# Patient Record
Sex: Female | Born: 1950 | Race: Black or African American | Hispanic: No | Marital: Married | Smoking: Never smoker
Health system: Southern US, Community
[De-identification: ages and names within clinical notes are randomized; demographics above are authoritative.]

## PROBLEM LIST (undated history)

## (undated) DIAGNOSIS — I2510 Atherosclerotic heart disease of native coronary artery without angina pectoris: Secondary | ICD-10-CM

## (undated) DIAGNOSIS — T7840XA Allergy, unspecified, initial encounter: Secondary | ICD-10-CM

## (undated) DIAGNOSIS — M1990 Unspecified osteoarthritis, unspecified site: Secondary | ICD-10-CM

## (undated) DIAGNOSIS — K5792 Diverticulitis of intestine, part unspecified, without perforation or abscess without bleeding: Secondary | ICD-10-CM

## (undated) DIAGNOSIS — N200 Calculus of kidney: Secondary | ICD-10-CM

## (undated) DIAGNOSIS — C801 Malignant (primary) neoplasm, unspecified: Secondary | ICD-10-CM

## (undated) DIAGNOSIS — I10 Essential (primary) hypertension: Secondary | ICD-10-CM

## (undated) DIAGNOSIS — E785 Hyperlipidemia, unspecified: Secondary | ICD-10-CM

## (undated) DIAGNOSIS — I219 Acute myocardial infarction, unspecified: Secondary | ICD-10-CM

## (undated) DIAGNOSIS — K219 Gastro-esophageal reflux disease without esophagitis: Secondary | ICD-10-CM

## (undated) DIAGNOSIS — R748 Abnormal levels of other serum enzymes: Secondary | ICD-10-CM

## (undated) DIAGNOSIS — D649 Anemia, unspecified: Secondary | ICD-10-CM

## (undated) HISTORY — DX: Abnormal levels of other serum enzymes: R74.8

## (undated) HISTORY — PX: SHOULDER SURGERY: SHX246

## (undated) HISTORY — DX: Calculus of kidney: N20.0

## (undated) HISTORY — DX: Hyperlipidemia, unspecified: E78.5

## (undated) HISTORY — PX: HERNIA REPAIR: SHX51

## (undated) HISTORY — DX: Malignant (primary) neoplasm, unspecified: C80.1

## (undated) HISTORY — PX: FOOT SURGERY: SHX648

## (undated) HISTORY — DX: Gastro-esophageal reflux disease without esophagitis: K21.9

## (undated) HISTORY — DX: Anemia, unspecified: D64.9

## (undated) HISTORY — DX: Atherosclerotic heart disease of native coronary artery without angina pectoris: I25.10

## (undated) HISTORY — DX: Essential (primary) hypertension: I10

## (undated) HISTORY — DX: Unspecified osteoarthritis, unspecified site: M19.90

## (undated) HISTORY — DX: Allergy, unspecified, initial encounter: T78.40XA

## (undated) HISTORY — PX: ABDOMINAL HYSTERECTOMY: SHX81

## (undated) HISTORY — PX: COLON SURGERY: SHX602

---

## 2010-01-30 ENCOUNTER — Observation Stay (HOSPITAL_COMMUNITY): Admission: EM | Admit: 2010-01-30 | Discharge: 2010-01-30 | Payer: Self-pay | Admitting: Emergency Medicine

## 2010-03-25 ENCOUNTER — Other Ambulatory Visit
Admission: RE | Admit: 2010-03-25 | Discharge: 2010-03-25 | Payer: Self-pay | Source: Home / Self Care | Admitting: Gynecology

## 2010-03-25 ENCOUNTER — Ambulatory Visit: Payer: Self-pay | Admitting: Gynecology

## 2010-04-28 ENCOUNTER — Ambulatory Visit
Admission: RE | Admit: 2010-04-28 | Discharge: 2010-04-28 | Payer: Self-pay | Source: Home / Self Care | Attending: Gynecology | Admitting: Gynecology

## 2010-07-09 LAB — DIFFERENTIAL
Lymphs Abs: 1.1 10*3/uL (ref 0.7–4.0)
Monocytes Relative: 2 % — ABNORMAL LOW (ref 3–12)
Neutro Abs: 6.5 10*3/uL (ref 1.7–7.7)
Neutrophils Relative %: 84 % — ABNORMAL HIGH (ref 43–77)

## 2010-07-09 LAB — COMPREHENSIVE METABOLIC PANEL
BUN: 15 mg/dL (ref 6–23)
Calcium: 9.9 mg/dL (ref 8.4–10.5)
Creatinine, Ser: 1.06 mg/dL (ref 0.4–1.2)
Glucose, Bld: 152 mg/dL — ABNORMAL HIGH (ref 70–99)
Total Protein: 7.4 g/dL (ref 6.0–8.3)

## 2010-07-09 LAB — CBC
HCT: 34.1 % — ABNORMAL LOW (ref 36.0–46.0)
MCH: 28.7 pg (ref 26.0–34.0)
MCHC: 32 g/dL (ref 30.0–36.0)
MCV: 89.7 fL (ref 78.0–100.0)
RDW: 13.9 % (ref 11.5–15.5)

## 2010-07-09 LAB — POCT CARDIAC MARKERS
Myoglobin, poc: 106 ng/mL (ref 12–200)
Troponin i, poc: <0.05 ng/mL (ref 0.00–0.09)

## 2010-07-09 LAB — BRAIN NATRIURETIC PEPTIDE: Pro B Natriuretic peptide (BNP): 34 pg/mL (ref 0.0–100.0)

## 2010-09-25 ENCOUNTER — Observation Stay (HOSPITAL_COMMUNITY)
Admission: EM | Admit: 2010-09-25 | Discharge: 2010-09-27 | Disposition: A | Discharge status: Home / Self Care | Payer: Medicare Other | Attending: Internal Medicine | Admitting: Internal Medicine

## 2010-09-25 DIAGNOSIS — J45909 Unspecified asthma, uncomplicated: Secondary | ICD-10-CM | POA: Insufficient documentation

## 2010-09-25 DIAGNOSIS — I252 Old myocardial infarction: Secondary | ICD-10-CM | POA: Insufficient documentation

## 2010-09-25 DIAGNOSIS — Z8679 Personal history of other diseases of the circulatory system: Secondary | ICD-10-CM | POA: Insufficient documentation

## 2010-09-25 DIAGNOSIS — Z85038 Personal history of other malignant neoplasm of large intestine: Secondary | ICD-10-CM | POA: Insufficient documentation

## 2010-09-25 DIAGNOSIS — Z79899 Other long term (current) drug therapy: Secondary | ICD-10-CM | POA: Insufficient documentation

## 2010-09-25 DIAGNOSIS — R0789 Other chest pain: Principal | ICD-10-CM | POA: Insufficient documentation

## 2010-09-25 DIAGNOSIS — E785 Hyperlipidemia, unspecified: Secondary | ICD-10-CM | POA: Insufficient documentation

## 2010-09-25 DIAGNOSIS — R9431 Abnormal electrocardiogram [ECG] [EKG]: Secondary | ICD-10-CM | POA: Insufficient documentation

## 2010-09-26 ENCOUNTER — Emergency Department (HOSPITAL_COMMUNITY): Payer: Medicare Other

## 2010-09-26 DIAGNOSIS — R079 Chest pain, unspecified: Secondary | ICD-10-CM

## 2010-09-26 LAB — CBC
MCH: 27.8 pg (ref 26.0–34.0)
Platelets: 237 10*3/uL (ref 150–400)
RBC: 4.13 MIL/uL (ref 3.87–5.11)
RDW: 14 % (ref 11.5–15.5)

## 2010-09-26 LAB — COMPREHENSIVE METABOLIC PANEL
AST: 32 U/L (ref 0–37)
Albumin: 3.8 g/dL (ref 3.5–5.2)
BUN: 18 mg/dL (ref 6–23)
Calcium: 10.5 mg/dL (ref 8.4–10.5)
Chloride: 100 mEq/L (ref 96–112)
Creatinine, Ser: 0.94 mg/dL (ref 0.4–1.2)
GFR calc Af Amer: >60 mL/min (ref >60)
Total Bilirubin: 0.3 mg/dL (ref 0.3–1.2)
Total Protein: 8 g/dL (ref 6.0–8.3)

## 2010-09-26 LAB — DIFFERENTIAL
Basophils Relative: 0 % (ref 0–1)
Eosinophils Absolute: 0.2 10*3/uL (ref 0.0–0.7)
Eosinophils Relative: 3 % (ref 0–5)
Monocytes Relative: 10 % (ref 3–12)
Neutrophils Relative %: 41 % — ABNORMAL LOW (ref 43–77)

## 2010-09-26 LAB — CK TOTAL AND CKMB (NOT AT ARMC)
CK, MB: 10.6 ng/mL (ref 0.3–4.0)
CK, MB: 8.5 ng/mL (ref 0.3–4.0)
Relative Index: 1.7 (ref 0.0–2.5)
Relative Index: 1.6 (ref 0.0–2.5)
Total CK: 520 U/L — ABNORMAL HIGH (ref 7–177)

## 2010-09-26 LAB — TROPONIN I: Troponin I: <0.3 ng/mL (ref <0.30)

## 2010-09-26 NOTE — H&P (Signed)
Angela Patton, Angela Patton NO.:  [id number]  MEDICAL RECORD NO.:  [id number]           PATIENT TYPE:  O  LOCATION:  1418                         FACILITY:  [facility]  PHYSICIAN:  Andreas Blower, MD       DATE OF BIRTH:  Dec 05, 1950  DATE OF ADMISSION:  09/25/2010 DATE OF DISCHARGE:                             HISTORY & PHYSICAL   PRIMARY CARE PHYSICIAN:  Dr. Tamera Punt.  CHIEF COMPLAINT:  Chest pain.  HISTORY OF PRESENT ILLNESS:  Angela Patton is a 60 year old African- American female with history of colon cancer, asthma, MI in 2007, who presents with the complaint about chest pain.  The patient has recently moved from [place] to [place] in September of 2011. Subsequently since then she says that she has been doing well, however, in the last 4 to 5 days has been having left-sided chest pain mainly below her breast which radiates to her back.  She reports the pain as being sharp in nature, not worsened with activity.  She did report that she move a container with some heavy objects but was having chest pain even prior to moving the container.  She had contacted Dr. Merita Norton who had instructed the patient to come to the ER for further evaluation.  She reports that this pain is unlike the pain that she has had in the past with her MI.  With her MI, she reports the pain as being similar to somebody sitting on her chest, however, this pain just feels like sharp pain.  She denies any pain running down her arm.  Denies any jaw pain. She denies any fevers, chills.  Denies any shortness of breath.  Does report some night time cough intermittently but not productive of any sputum.  Denies any abdominal pain, diarrhea.  Denies any headaches or vision changes.  REVIEW OF SYSTEMS:  All systems were reviewed with the patient with positive as per HPI, otherwise, all other systems are negative.  PAST MEDICAL HISTORY: 1. History of MI in 2007.  The patient is uncertain if she  had any     stents placed. 2. History of colon cancer status post colectomy. 3. History of asthma.  SOCIAL HISTORY:  The patient does not smoke.  Denies any illegal drugs or substances.  Is currently retired.  Drinks few glasses of alcohol every 2 weeks.  FAMILY HISTORY:  Significant for mother having stomach cancer.  Father having colon cancer.  Had a brother with stomach cancer.  Had another brother with non-Hodgkin's lymphoma.  HOME MEDICATIONS: 1. Albuterol inhaler every 2 puffs every 4 hours as needed for     shortness of breath. 2. Vitamin C 500 mg p.o. daily 3. Fish oil 2400 mg p.o. daily 4. Tylenol Extra Strength 500 mg 2 to 3 tablets every 6 hours as     needed. 5. Aspirin 81 mg p.o. daily  PHYSICAL EXAMINATION:  VITAL SIGNS:  Temperature is 97.4, pulse 71, respirations 18, blood pressure is 124/72, satting at 98% on room air. GENERAL:  The patient was alert, oriented; not appeared to be in acute distress.  She was  lying in bed comfortably. HEENT:  Extraocular motions are intact.  Pupils equal, round.  Had moist mucous membranes. NECK:  Supple. HEART:  Regular with S1, S2. CHEST:  Did not have any reproducible pain with palpation. LUNGS:  Clear to auscultation bilaterally. ABDOMEN:  Soft, nontender, nondistended.  Positive bowel sound. EXTREMITIES:  The patient had good peripheral pulses with trace edema. NEURO:  Cranial nerves II through XII grossly intact, 5/5 motor strength in upper as well as lower extremities.  EKG reviewed, showed sinus rhythm with heart rate of 60.  RADIOLOGY/IMAGING:  Chest x-ray, two-view, on September 26, 2010, which shows no acute cardiopulmonary disease.  LABORATORY DATA:  CBC shows a white count of 6.9, hemoglobin 11.5, hematocrit 36.3, platelet count 237.  Electrolytes normal with a BUN of 18, creatinine 0.94.  Liver function tests normal.  Troponin negative x2.  ASSESSMENT AND PLAN: 1. Chest pain.  The patient is admitted to telemetry  and has had 2     negative troponins so far.  The patient is ruled out for acute     coronary syndrome.  Given her history of MI in 2007, Cardiology is     consulted for further evaluation.  The patient is on aspirin.  Will     send for lipid profile to risk stratify the patient. 2. History of coronary artery disease with MI in 2007, management as     indicated above. 3. History of asthma, stable.  Continue p.r.n. inhalers. 4. History of colon cancer, status post resection, not an active issue     at this time. 5. Prophylaxis.  Lovenox for DVT prophylaxis. 6. Code status.  The patient is full code.  Time spent on admission, talking to the patient, talking to consultants, and coordinating care was 45 minutes.   Andreas Blower, MD   SR/MEDQ  D:  09/26/2010  T:  09/26/2010  Job:  [id number]  Electronically Signed by Wardell Heath Moriah Loughry  on 09/26/2010 06:47:36 PM

## 2010-09-27 LAB — CK TOTAL AND CKMB (NOT AT ARMC)
CK, MB: 6.3 ng/mL (ref 0.3–4.0)
Relative Index: 1.7 (ref 0.0–2.5)
Total CK: 380 U/L — ABNORMAL HIGH (ref 7–177)

## 2010-09-27 LAB — LIPID PANEL
Cholesterol: 216 mg/dL — ABNORMAL HIGH (ref 0–200)
LDL Cholesterol: 151 mg/dL — ABNORMAL HIGH (ref 0–99)
Triglycerides: 81 mg/dL (ref <150)
VLDL: 16 mg/dL (ref 0–40)

## 2010-09-28 NOTE — Discharge Summary (Signed)
Angela Patton, GOODGAME NO.:  [id number]  MEDICAL RECORD NO.:  [id number]           PATIENT TYPE:  O  LOCATION:  1418                         FACILITY:  [facility]  PHYSICIAN:  Andreas Blower, MD       DATE OF BIRTH:  Jan 28, 1951  DATE OF ADMISSION:  09/25/2010 DATE OF DISCHARGE:  09/27/2010                              DISCHARGE SUMMARY   PRIMARY CARE PHYSICIAN:  Tamera Punt, PA  CARDIOLOGIST:  Georga Hacking, M.D.  DISCHARGE DIAGNOSES: 1. Chest pain, ruled out for acute coronary syndrome. 2. History of coronary artery disease with myocardial infarction in     2007. 3. History of asthma. 4. History of colon cancer, status post resection. 5. Hyperlipidemia.  DISCHARGE MEDICATIONS: 1. Pravastatin 20 mg p.o. daily at bedtime. 2. Aspirin 81 mg p.o. daily. 3. Fish oil 2400 mg p.o. daily. 4. Albuterol inhaler 2 puffs every 4 hours as needed for shortness of     breath. 5. Tylenol 500 mg 2-3 tablets every 6 hours as needed for headache or     pain. 6. Vitamin C 500 mg p.o. daily.  BRIEF ADMITTING HISTORY AND PHYSICAL:  Angela Patton is a 60 year old African American female with history of colon cancer, asthma, and MI in 2007 who presented with complaints of chest pain on September 26, 2010.  The patient had chest x-ray 2-view which shows no acute cardiopulmonary process.  LABORATORY DATA:  CBC shows a white count of 6.9, hemoglobin 11.5, hematocrit 36.3, platelet count 237.  Electrolytes normal with BUN of 18, creatinine 0.94.  Liver function tests normal.  Troponin is negative x3.  LDL is 151.  TSH is 2.581.  HOSPITAL COURSE: 1. Chest pain.  The patient was admitted and was ruled out for acute     coronary artery syndrome.  Given her history of coronary artery     disease, Cardiology was consulted.  Cardiology evaluated the     patient and thought that the patient was atypical and was not due     to ischemia, however, did recommend getting a 2-D treadmill  stress     test as an outpatient.  Uncertain if the pain is musculoskeletal as     the patient reports that she was moving some objects prior to     presentation. 2. History of coronary artery disease with MI in 2007, management as     above.  The patient is on aspirin. 3. History of asthma, stable, not an active issue. 4. Elevated CK, may be due to recent activity with lifting a few     objects at home.  The patient's CK has been trending down during     the hospital stay. 5. Hyperlipidemia.  LDL is 151.  Given the  patient's risk facts,      goal LDL to less than 100.  Given the patient's elevated     CK, which is most likely from recent activity, I will start     the patient on low-dose pravastatin.  The patient will need     to have CK levels checked as well as fasting lipid  panel in about 3     months to determine if she needs further titration of cholesterol     medications as an outpatient.  DISPOSITION AND FOLLOWUP:  The patient to follow up with Ms. Merita Norton, her primary care physician, in 1-2 weeks as needed.  The patient was instructed to call [phone] on September 28, 2010, in the morning to set up a 2-D treadmill Cardiolite.  Time spent on discharge talking to the patient and coordinating care was 25 minutes.   Andreas Blower, MD   SR/MEDQ  D:  09/27/2010  T:  09/27/2010  Job:  [id number]  Electronically Signed by Wardell Heath Cesar Alf  on 09/28/2010 08:12:27 PM

## 2010-09-29 ENCOUNTER — Telehealth: Payer: Self-pay

## 2010-09-29 NOTE — Telephone Encounter (Signed)
ROI faxed to [facility] @ [phone], records received back pt need to make NP appt gave to [facility] 09/29/10/km

## 2010-10-02 NOTE — Consult Note (Signed)
Angela Patton, Patton NO.:  [id number]  MEDICAL RECORD NO.:  [id number]           PATIENT TYPE:  O  LOCATION:  1418                         FACILITY:  [facility]  PHYSICIAN:  Luis Abed, MD, FACCDATE OF BIRTH:  1950/06/21  DATE OF CONSULTATION: DATE OF DISCHARGE:                                CONSULTATION   The patient has moved to this area from [place].  She previously has a history of chest pain.  She says that she had a heart attack.  However, she gives this story that she underwent cardiac catheterization in 2007 and told it was "okay."  She also had a recent echo, and she was told that it was okay.  On September 25, 2010, she was looking for some things and working in her garage.  She then developed some chest discomfort.  She came to the emergency room for further evaluation.  Since being here, she has been stable.  Her troponins have been normal.  She has not had any recurrent symptoms.  ALLERGIES:  PENICILLIN.  MEDICATIONS AT HOME:  She was on Proventil, fish oil, Tylenol and Ecotrin.  OTHER MEDICAL PROBLEMS:  See the complete list below.  SOCIAL HISTORY:  The patient does not smoke.  FAMILY HISTORY:  There is no strong family history of coronary disease.  REVIEW OF SYSTEMS:  The patient denies fever, chills, headache, sweats, rash, change in vision, change in hearing, cough, nausea or vomiting, urinary symptoms.  All other systems are reviewed and are negative.  PHYSICAL EXAMINATION:  VITAL SIGNS:  Blood pressure is 124/72 with a pulse of 71. GENERAL:  The patient is comfortable.  She is overweight.  The patient is oriented to person, time and place.  Affect is normal. HEENT:  Head is atraumatic. NECK:  There is no jugular venous distention.  There are no carotid bruits. LUNGS:  Clear.  Respiratory effort is not labored. CARDIAC:  Reveals S1 and S2.  There are no clicks or significant murmurs. ABDOMEN:  Soft. EXTREMITIES:  There is no  peripheral edema.  There are no musculoskeletal deformities. SKIN:  There are no skin rashes.  EKG reveals nonspecific ST-T wave abnormalities with normal sinus rhythm.  Hemoglobin is 11.5.  BUN is 18 with creatinine 0.94.  There are 2 cardiac enzyme sets.  The troponin is less than 0.30 on 2 occasions. CPK initially is 641, going down to 520 and the initial MB is 10.6, going down to 8.5.  The third set is pending.  Chest x-ray reveals no acute cardiopulmonary disease.  PROBLEMS:  Include: 1. History of colon cancer. 2. History of asthma. 3. History of an MI in 2007.  We do not have records.  The patient     tells me that her cardiac catheterization was "okay." 4. Admission with chest discomfort.  At this time, there is no     evidence of an acute coronary syndrome.  We still need a third     cardiac enzyme and a followup EKG. 5. Elevated CPK.  The patient is not on a statin.  It is possible that  the CPK is from some muscle use while she was working in her     garage.  This will have to be followed.  RECOMMENDATIONS: 1. Await the patient's third troponin. 2. Follow her CPK. 3. Repeat EKG. 4. Get her records from [place]. 5. If the troponin and the EKG are okay, she could go home soon with     an outpatient cardiac workup, most probably a Lexiscan Myoview.     Luis Abed, MD, [facility]     JDK/MEDQ  D:  09/26/2010  T:  09/26/2010  Job:  [id number]  Electronically Signed by Willa Rough MD FACC on 10/02/2010 05:46:24 PM

## 2010-10-21 ENCOUNTER — Encounter: Payer: Self-pay | Admitting: Cardiology

## 2010-10-22 ENCOUNTER — Ambulatory Visit (INDEPENDENT_AMBULATORY_CARE_PROVIDER_SITE_OTHER): Payer: Medicare Other | Admitting: Cardiology

## 2010-10-22 ENCOUNTER — Encounter: Payer: Self-pay | Admitting: Cardiology

## 2010-10-22 DIAGNOSIS — Z79899 Other long term (current) drug therapy: Secondary | ICD-10-CM

## 2010-10-22 DIAGNOSIS — R072 Precordial pain: Secondary | ICD-10-CM

## 2010-10-22 DIAGNOSIS — E7800 Pure hypercholesterolemia, unspecified: Secondary | ICD-10-CM

## 2010-10-22 DIAGNOSIS — I2510 Atherosclerotic heart disease of native coronary artery without angina pectoris: Secondary | ICD-10-CM

## 2010-10-22 DIAGNOSIS — R079 Chest pain, unspecified: Secondary | ICD-10-CM

## 2010-10-22 DIAGNOSIS — E785 Hyperlipidemia, unspecified: Secondary | ICD-10-CM

## 2010-10-22 NOTE — Progress Notes (Signed)
HPI: a 60 year old patient of Dr. Myrtis Ser for followup. Patient apparently with myocardial infarction in 2007 but catheterization okay. No records available. I have an echocardiogram dated April of 2010 from [facility] cardiovascular medicine that showed normal LV function and trace mitral/tricuspid regurgitation. Nuclear study in November of 2009 showed an ejection fraction of 63% with no ischemia or infarction. Admitted to [facility] in June of 2012 with chest pain. Cardiac enzymes negative. At the time of her evaluation the patient describes 2 days of continuous chest pain without complete resolution. It increased with certain movements. That pain has improved but she continues to have occasional pain in the left axillary area. No dyspnea or syncope. Occasional mild pedal edema.  Current Outpatient Prescriptions  Medication Sig Dispense Refill  . acetaminophen (TYLENOL) 325 MG tablet Take 650 mg by mouth every 6 (six) hours as needed.        Marland Kitchen albuterol (PROVENTIL,VENTOLIN) 90 MCG/ACT inhaler Inhale 2 puffs into the lungs every 6 (six) hours as needed.        Marland Kitchen aspirin 81 MG tablet Take 81 mg by mouth daily.        . fish oil-omega-3 fatty acids 1000 MG capsule Take 2 g by mouth daily.        . fluticasone (FLOVENT HFA) 110 MCG/ACT inhaler Inhale 1 puff into the lungs 2 (two) times daily.        . pravastatin (PRAVACHOL) 20 MG tablet Take 20 mg by mouth daily.        . vitamin C (ASCORBIC ACID) 500 MG tablet Take 500 mg by mouth daily.        Marland Kitchen DISCONTD: ALBUTEROL IN          Past Medical History  Diagnosis Date  . Hypertension   . Coronary artery disease     myo cardial infraction in 2007  . Asthma   . Cancer     colon   . Hyperlipidemia     No past surgical history on file.  History   Social History  . Marital Status: Married    Spouse Name: N/A    Number of Children: N/A  . Years of Education: N/A   Occupational History  . Not on file.   Social History Main Topics    . Smoking status: Never Smoker   . Smokeless tobacco: Not on file  . Alcohol Use: Not on file  . Drug Use: Not on file  . Sexually Active: Not on file   Other Topics Concern  . Not on file   Social History Narrative  . No narrative on file    ROS: no fevers or chills, productive cough, hemoptysis, dysphasia, odynophagia, melena, hematochezia, dysuria, hematuria, rash, seizure activity, orthopnea, PND, pedal edema, claudication. Remaining systems are negative.  Physical Exam: Well-developed well-nourished in no acute distress.  Skin is warm and dry.  HEENT is normal.  Neck is supple. No thyromegaly.  Chest is clear to auscultation with normal expansion.  Cardiovascular exam is regular rate and rhythm.  Abdominal exam nontender or distended. No masses palpated. Extremities show no edema. neuro grossly intact  ECG Normal sinus rhythm at a rate of 61. Left ventricular hypertrophy. Inferolateral T wave changes. No change since 09-29-10

## 2010-10-22 NOTE — Assessment & Plan Note (Signed)
Symptoms are atypical. Schedule stress echocardiogram and then followup Dr. Myrtis Ser.

## 2010-10-22 NOTE — Patient Instructions (Signed)
Your physician recommends that you schedule a follow-up appointment in: 4 WEEKS WITH DR Myrtis Ser  Your physician has requested that you have a stress echocardiogram. For further information please visit [email or web address] Please follow instruction sheet as given.   Your physician recommends that you return for lab work in: WITH STRESS TEST

## 2010-10-22 NOTE — Assessment & Plan Note (Signed)
Continue aspirin and statin. 

## 2010-10-22 NOTE — Assessment & Plan Note (Signed)
Continue statin. Check lipids and liver when she returns for stress echo.

## 2010-10-29 ENCOUNTER — Ambulatory Visit (HOSPITAL_BASED_OUTPATIENT_CLINIC_OR_DEPARTMENT_OTHER): Payer: Medicare Other | Admitting: Radiology

## 2010-10-29 ENCOUNTER — Ambulatory Visit (HOSPITAL_COMMUNITY): Payer: Medicare Other | Attending: Cardiology | Admitting: Radiology

## 2010-10-29 DIAGNOSIS — R0989 Other specified symptoms and signs involving the circulatory and respiratory systems: Secondary | ICD-10-CM

## 2010-10-29 DIAGNOSIS — R072 Precordial pain: Secondary | ICD-10-CM | POA: Insufficient documentation

## 2010-10-29 DIAGNOSIS — R0609 Other forms of dyspnea: Secondary | ICD-10-CM | POA: Insufficient documentation

## 2010-10-29 DIAGNOSIS — E669 Obesity, unspecified: Secondary | ICD-10-CM | POA: Insufficient documentation

## 2010-10-29 DIAGNOSIS — R42 Dizziness and giddiness: Secondary | ICD-10-CM | POA: Insufficient documentation

## 2010-10-29 DIAGNOSIS — R5381 Other malaise: Secondary | ICD-10-CM | POA: Insufficient documentation

## 2010-10-29 DIAGNOSIS — E785 Hyperlipidemia, unspecified: Secondary | ICD-10-CM | POA: Insufficient documentation

## 2010-11-22 ENCOUNTER — Encounter: Payer: Self-pay | Admitting: Cardiology

## 2010-11-22 DIAGNOSIS — C801 Malignant (primary) neoplasm, unspecified: Secondary | ICD-10-CM | POA: Insufficient documentation

## 2010-11-22 DIAGNOSIS — R748 Abnormal levels of other serum enzymes: Secondary | ICD-10-CM | POA: Insufficient documentation

## 2010-11-22 DIAGNOSIS — I2510 Atherosclerotic heart disease of native coronary artery without angina pectoris: Secondary | ICD-10-CM | POA: Insufficient documentation

## 2010-11-22 DIAGNOSIS — I10 Essential (primary) hypertension: Secondary | ICD-10-CM | POA: Insufficient documentation

## 2010-11-22 DIAGNOSIS — E785 Hyperlipidemia, unspecified: Secondary | ICD-10-CM | POA: Insufficient documentation

## 2010-11-24 ENCOUNTER — Ambulatory Visit (INDEPENDENT_AMBULATORY_CARE_PROVIDER_SITE_OTHER): Payer: Medicare Other | Admitting: Cardiology

## 2010-11-24 ENCOUNTER — Encounter: Payer: Self-pay | Admitting: Cardiology

## 2010-11-24 DIAGNOSIS — R079 Chest pain, unspecified: Secondary | ICD-10-CM

## 2010-11-24 DIAGNOSIS — R748 Abnormal levels of other serum enzymes: Secondary | ICD-10-CM

## 2010-11-24 LAB — CK: Total CK: 566 U/L — ABNORMAL HIGH (ref 7–177)

## 2010-11-24 NOTE — Progress Notes (Signed)
HPI Patient is seen for cardiology followup.  I had seen her in the hospital recently.  She was seen first post hospitalization by Dr. Jens Som in our office on October 22, 2010.  The patient was stable.  Decision was made to proceed with a stress echo.  The study was done October 29, 2010.  The resting ejection fraction was 60%.  With stress global function was vigorous.  I could not see all of the walls.  My impression was that there was no definite abnormality.  I feel there was enough data to say that there was no definite ischemia.  She has done well since that time.  It is of note that during the hospitalization her troponins were normal but her CPKs were elevated.  We will order followup CPK to see if she has chronic CPK elevation.  If so that information will be passed to her primary physician. Allergies  Allergen Reactions  . Penicillins     Current Outpatient Prescriptions  Medication Sig Dispense Refill  . acetaminophen (TYLENOL) 325 MG tablet Take 650 mg by mouth every 6 (six) hours as needed.        Marland Kitchen albuterol (PROVENTIL,VENTOLIN) 90 MCG/ACT inhaler Inhale 2 puffs into the lungs every 6 (six) hours as needed.        Marland Kitchen aspirin 81 MG tablet Take 81 mg by mouth daily.        . fish oil-omega-3 fatty acids 1000 MG capsule Take 2 g by mouth daily.        . fluticasone (FLOVENT HFA) 110 MCG/ACT inhaler Inhale 1 puff into the lungs 2 (two) times daily.        . pravastatin (PRAVACHOL) 20 MG tablet Take 20 mg by mouth daily.        . vitamin C (ASCORBIC ACID) 500 MG tablet Take 500 mg by mouth daily.          History   Social History  . Marital Status: Married    Spouse Name: N/A    Number of Children: N/A  . Years of Education: N/A   Occupational History  . Not on file.   Social History Main Topics  . Smoking status: Never Smoker   . Smokeless tobacco: Not on file  . Alcohol Use: Not on file  . Drug Use: Not on file  . Sexually Active: Not on file   Other Topics Concern  . Not  on file   Social History Narrative  . No narrative on file    No family history on file.  Past Medical History  Diagnosis Date  . Hypertension   . Coronary artery disease     MI per patient 2007 elsewhere, no records, pt. says cath  was OK  . Asthma   . Cancer     colon   . Hyperlipidemia   . Chest pain     Hospital 09/2010,  . Elevated CPK     Hospital 09/2010,  troponin normal    No past surgical history on file.  ROS  Patient denies fever, chills, headache, sweats, rash, change in vision, change in hearing,Chest pain, cough, nausea vomiting, urinary symptoms.  All other systems are reviewed and are negative.  PHYSICAL EXAM Patient is stable.  She is overweight.  Head is atraumatic.  There is no xanthelasma.  Lungs are clear.  Respiratory effort is unlabored.  Cardiac exam reveals S1 and S2.  There are no clicks or significant murmurs.  The abdomen is soft.  There is no edema. Filed Vitals:   11/24/10 0928  BP: 128/56  Pulse: 60  Height: 5\' 3"  (1.6 m)  Weight: 222 lb (100.699 kg)    EKG Was done today.  I have reviewed it.  It was not necessary and we will discharge.  There is normal sinus rhythm.  ASSESSMENT & PLAN

## 2010-11-24 NOTE — Assessment & Plan Note (Signed)
At this point no further cardiac workup is needed for her chest pain.  She can be followed.

## 2010-11-24 NOTE — Patient Instructions (Addendum)
Your physician recommends that you schedule a follow-up appointment in: 1 year  Your physician recommends that you return for lab work in: today

## 2010-11-24 NOTE — Assessment & Plan Note (Signed)
Patient will have followup CPK today to see her new baseline.  Information be sent to her primary physician.

## 2010-12-08 ENCOUNTER — Encounter: Payer: Self-pay | Admitting: Cardiology

## 2011-05-30 ENCOUNTER — Emergency Department (HOSPITAL_COMMUNITY)
Admission: EM | Admit: 2011-05-30 | Discharge: 2011-05-30 | Disposition: A | Discharge status: Home / Self Care | Payer: Medicare Other | Attending: Emergency Medicine | Admitting: Emergency Medicine

## 2011-05-30 ENCOUNTER — Emergency Department (HOSPITAL_COMMUNITY): Payer: Medicare Other

## 2011-05-30 ENCOUNTER — Encounter (HOSPITAL_COMMUNITY): Payer: Self-pay

## 2011-05-30 DIAGNOSIS — Z85038 Personal history of other malignant neoplasm of large intestine: Secondary | ICD-10-CM | POA: Insufficient documentation

## 2011-05-30 DIAGNOSIS — J45909 Unspecified asthma, uncomplicated: Secondary | ICD-10-CM | POA: Insufficient documentation

## 2011-05-30 DIAGNOSIS — Z79899 Other long term (current) drug therapy: Secondary | ICD-10-CM | POA: Insufficient documentation

## 2011-05-30 DIAGNOSIS — R109 Unspecified abdominal pain: Secondary | ICD-10-CM | POA: Insufficient documentation

## 2011-05-30 DIAGNOSIS — R10819 Abdominal tenderness, unspecified site: Secondary | ICD-10-CM | POA: Insufficient documentation

## 2011-05-30 DIAGNOSIS — R350 Frequency of micturition: Secondary | ICD-10-CM | POA: Insufficient documentation

## 2011-05-30 DIAGNOSIS — M549 Dorsalgia, unspecified: Secondary | ICD-10-CM | POA: Insufficient documentation

## 2011-05-30 DIAGNOSIS — E785 Hyperlipidemia, unspecified: Secondary | ICD-10-CM | POA: Insufficient documentation

## 2011-05-30 DIAGNOSIS — I10 Essential (primary) hypertension: Secondary | ICD-10-CM | POA: Insufficient documentation

## 2011-05-30 DIAGNOSIS — Z7982 Long term (current) use of aspirin: Secondary | ICD-10-CM | POA: Insufficient documentation

## 2011-05-30 DIAGNOSIS — I2510 Atherosclerotic heart disease of native coronary artery without angina pectoris: Secondary | ICD-10-CM | POA: Insufficient documentation

## 2011-05-30 LAB — HEPATIC FUNCTION PANEL
ALT: 20 U/L (ref 0–35)
AST: 26 U/L (ref 0–37)
Albumin: 3.6 g/dL (ref 3.5–5.2)
Alkaline Phosphatase: 96 U/L (ref 39–117)
Bilirubin, Direct: 0.1 mg/dL (ref 0.0–0.3)
Indirect Bilirubin: 0.3 mg/dL (ref 0.3–0.9)
Total Bilirubin: 0.4 mg/dL (ref 0.3–1.2)
Total Protein: 7.8 g/dL (ref 6.0–8.3)

## 2011-05-30 LAB — URINALYSIS, ROUTINE W REFLEX MICROSCOPIC
Glucose, UA: NEGATIVE mg/dL
Hgb urine dipstick: NEGATIVE
Protein, ur: NEGATIVE mg/dL

## 2011-05-30 LAB — DIFFERENTIAL
Eosinophils Relative: 1 % (ref 0–5)
Lymphocytes Relative: 27 % (ref 12–46)
Lymphs Abs: 1.3 10*3/uL (ref 0.7–4.0)
Monocytes Absolute: 0.2 10*3/uL (ref 0.1–1.0)

## 2011-05-30 LAB — CBC
HCT: 37.4 % (ref 36.0–46.0)
MCV: 87.8 fL (ref 78.0–100.0)
RBC: 4.26 MIL/uL (ref 3.87–5.11)
RDW: 13.5 % (ref 11.5–15.5)
WBC: 5 10*3/uL (ref 4.0–10.5)

## 2011-05-30 LAB — BASIC METABOLIC PANEL
BUN: 14 mg/dL (ref 6–23)
CO2: 24 mEq/L (ref 19–32)
Calcium: 10.3 mg/dL (ref 8.4–10.5)
Creatinine, Ser: 0.78 mg/dL (ref 0.50–1.10)
Glucose, Bld: 105 mg/dL — ABNORMAL HIGH (ref 70–99)

## 2011-05-30 MED ORDER — HYDROMORPHONE HCL PF 1 MG/ML IJ SOLN
1.0000 mg | Freq: Once | INTRAMUSCULAR | Status: AC
  Administered 2011-05-30: 1 mg via INTRAVENOUS
  Filled 2011-05-30: qty 1

## 2011-05-30 MED ORDER — HYDROMORPHONE HCL PF 1 MG/ML IJ SOLN
1.0000 mg | Freq: Once | INTRAMUSCULAR | Status: AC
  Administered 2011-05-30: 1 mg via INTRAMUSCULAR
  Filled 2011-05-30: qty 1

## 2011-05-30 MED ORDER — IOHEXOL 300 MG/ML  SOLN: 20.0000 mL | INTRAMUSCULAR | Status: AC

## 2011-05-30 MED ORDER — OXYCODONE-ACETAMINOPHEN 5-325 MG PO TABS: 2.0000 | ORAL_TABLET | ORAL | Status: AC | PRN

## 2011-05-30 MED ORDER — IOHEXOL 300 MG/ML  SOLN
100.0000 mL | Freq: Once | INTRAMUSCULAR | Status: AC | PRN
  Administered 2011-05-30: 100 mL via INTRAVENOUS

## 2011-05-30 MED ORDER — ONDANSETRON HCL 4 MG/2ML IJ SOLN
INTRAMUSCULAR | Status: AC
  Administered 2011-05-30: 4 mg
  Filled 2011-05-30: qty 2

## 2011-05-30 NOTE — ED Notes (Signed)
Pt states 10/10 lower back pain at the time. Medicated. Vital signs stable. Will continue to monitor. Family remains at bedside.

## 2011-05-30 NOTE — ED Notes (Signed)
Pt was seen at an urgent care center in [place] and diagnosed her with a pulled muscle.  Pt was given Toradol at the urgent care center with no relief.  Pt presents here with right lower back pain radiating towards the abdomen.  Pain is rated as 10/10 and is relieved by nothing.

## 2011-05-30 NOTE — ED Notes (Signed)
Pt pointing to the right flank, reports urinary frequency

## 2011-05-30 NOTE — ED Provider Notes (Signed)
History     CSN: [id number]  Arrival date & time 05/30/11  1400   First MD Initiated Contact with Patient 05/30/11 1501      Chief Complaint  Patient presents with  . Back Pain    (Consider location/radiation/quality/duration/timing/severity/associated sxs/prior treatment) HPI Comments: Right back/flank pain, started this morning, radiates to right groin.  No dysuria, but some frequency.  No saddle anesthesia, LE weakness or numbness.  No relieving or aggravating factors.  Seen at urgent care, at which time she was told she had a pulled muscle.  Given Toradol with no relief.  Patient is a 61 y.o. female presenting with flank pain. The history is provided by the patient.  Flank Pain This is a new problem. The current episode started today. The problem occurs constantly. The problem has been gradually worsening. Pertinent negatives include no abdominal pain, chest pain, congestion, coughing, fever, nausea or vomiting. Associated symptoms comments: Urinary frequenc. The symptoms are aggravated by nothing (can't get comfortable). Treatments tried: tylenol, toradol. The treatment provided no relief.    Past Medical History  Diagnosis Date  . Hypertension   . Coronary artery disease     MI per patient 2007 elsewhere, no records, pt. says cath  was OK  . Asthma   . Cancer     colon   . Hyperlipidemia   . Chest pain     Hospital 09/2010,  /   Stress echo October 29, 2010, vigorous LV function with stress.  All walls could not be assessed fully but it was felt that there was no ischemia.  . Elevated CPK     Hospital 09/2010,  troponin normal    History reviewed. No pertinent past surgical history.  History reviewed. No pertinent family history.  History  Substance Use Topics  . Smoking status: Never Smoker   . Smokeless tobacco: Not on file  . Alcohol Use: Not on file    OB History    Grav Para Term Preterm Abortions TAB SAB Ect Mult Living                  Review of Systems    Constitutional: Negative for fever.  HENT: Negative for congestion.   Respiratory: Negative for cough and shortness of breath.   Cardiovascular: Negative for chest pain.  Gastrointestinal: Negative for nausea, vomiting, abdominal pain and diarrhea.  Genitourinary: Positive for flank pain. Negative for difficulty urinating.  All other systems reviewed and are negative.    Allergies  Penicillins  Home Medications   Current Outpatient Rx  Name Route Sig Dispense Refill  . ACETAMINOPHEN 325 MG PO TABS Oral Take 650 mg by mouth every 6 (six) hours as needed.      . ALBUTEROL 90 MCG/ACT IN AERS Inhalation Inhale 2 puffs into the lungs every 6 (six) hours as needed.      . ASPIRIN 81 MG PO TABS Oral Take 81 mg by mouth daily.      . OMEGA-3 FATTY ACIDS 1000 MG PO CAPS Oral Take 2 g by mouth daily.      Marland Kitchen FLUTICASONE PROPIONATE  HFA 110 MCG/ACT IN AERO Inhalation Inhale 1 puff into the lungs 2 (two) times daily.      Marland Kitchen PRAVASTATIN SODIUM 20 MG PO TABS Oral Take 20 mg by mouth daily.      Marland Kitchen VITAMIN C 500 MG PO TABS Oral Take 500 mg by mouth daily.        BP 149/63  Pulse 62  Temp(Src) 97.9 F (36.6 C) (Oral)  Resp 22  SpO2 99%  Physical Exam  Nursing note and vitals reviewed. Constitutional: She is oriented to person, place, and time. She appears well-developed and well-nourished. No distress.  HENT:  Head: Normocephalic and atraumatic.  Mouth/Throat: Oropharynx is clear and moist.  Eyes: Conjunctivae are normal. Pupils are equal, round, and reactive to light. No scleral icterus.  Neck: Neck supple.  Cardiovascular: Normal rate, regular rhythm, normal heart sounds and intact distal pulses.   No murmur heard. Pulmonary/Chest: Effort normal and breath sounds normal. No stridor. No respiratory distress. She has no rales.  Abdominal: Soft. Bowel sounds are normal. She exhibits no distension. There is tenderness (RLQ). There is no rebound and no guarding.       Right flank tenderness  to palpation.  No swelling or redness.  Musculoskeletal: Normal range of motion.  Neurological: She is alert and oriented to person, place, and time.  Skin: Skin is warm and dry. No rash noted.  Psychiatric: She has a normal mood and affect. Her behavior is normal.    ED Course  Procedures (including critical care time)  Labs Reviewed  URINALYSIS, ROUTINE W REFLEX MICROSCOPIC - Abnormal; Notable for the following:    APPearance TURBID (*)    All other components within normal limits  BASIC METABOLIC PANEL - Abnormal; Notable for the following:    Glucose, Bld 105 (*)    GFR calc non Af Amer 88 (*)    All other components within normal limits  URINE MICROSCOPIC-ADD ON - Abnormal; Notable for the following:    Bacteria, UA FEW (*)    All other components within normal limits  CBC  DIFFERENTIAL  HEPATIC FUNCTION PANEL   Ct Abdomen Pelvis W Contrast  05/30/2011  *RADIOLOGY REPORT*  Clinical Data: Severe right lower quadrant pain radiating to back.  CT ABDOMEN AND PELVIS WITH CONTRAST  Technique:  Multidetector CT imaging of the abdomen and pelvis was performed following the standard protocol during bolus administration of intravenous contrast.  Contrast: OMNIPAQUE IOHEXOL 300 MG/ML IV SOLN  Comparison: None.  Findings: Moderate diffuse hepatic steatosis is seen, however no liver masses are identified.  Small calcified gallstones are noted, however there is no evidence of cholecystitis or biliary dilatation.  The spleen, pancreas, adrenal glands, and kidneys are normal in appearance.  No evidence of hydronephrosis.  No soft tissue masses or lymphadenopathy identified within the abdomen or pelvis.  Previous hysterectomy noted.  Adnexa are unremarkable in appearance.  Beam hardening artifact is seen to the inferior pelvis due to right hip prosthesis.  Postoperative changes are noted within the anterior abdominal wall soft tissues.  No evidence of hernia.  Normal appendix is visualized.  No  other inflammatory process or abnormal fluid collections identified.  No evidence of bowel wall thickening or dilatation.  IMPRESSION:  1.  No evidence of appendicitis or other acute findings. 2.  Moderate hepatic steatosis. 3.  Cholelithiasis.  No evidence of cholecystitis.  Original Report Authenticated By: Danae Orleans, M.D.   All radiology studies independently viewed by me.     1. Flank pain       MDM  61 yo female w hx of colon ca s/p bowel resection presenting with right flank pain radiating to right groin.  Flank and RLQ tender.  VSS.  Possibly nephrolithiasis, but no hematuria.  Also considering early bowel obstruction, AAA, and appendicitis.  Labwork and CT pending.  Dilaudid for pain.  CT showed gallstones without signs of cholecystitis.  Otherwise negative for obstruction, appendicitis, hydronephrosis.  Pain improved after dilaudid.  No RUQ tenderness.  Clinical picture inconsistent with acute cholecystitis or cholangitis.  Will dc home with PCP follow up.        Warnell Forester, MD 05/30/11 1900

## 2011-05-30 NOTE — ED Notes (Signed)
EDP at the bedside to explain plan of care to patient and family.

## 2011-05-30 NOTE — ED Notes (Signed)
Pt states she feels much better, 4/10 lower back pain at the time. Resting quietly at the time. Family at the bedside. Vital signs stable.

## 2011-05-30 NOTE — ED Notes (Signed)
Pt resting quietly at the time. Returned from CT scan. Vital signs stable. Family at the bedside.

## 2011-05-30 NOTE — ED Notes (Signed)
Pt woke this am with pain in the right lower back, to urgent care and given Toradol with no relief, nothing makes it better

## 2011-05-30 NOTE — ED Provider Notes (Signed)
See my prior note.  Angela Patton. [name], MD 05/30/11 2215

## 2011-05-30 NOTE — ED Provider Notes (Addendum)
  I performed a history and physical examination of Angela Patton and discussed her management with Dr. Loretha Stapler.  I agree with the history, physical, assessment, and plan of care, with the following exceptions: None  I was present for the following procedures: None Time Spent in Critical Care of the patient: None Time spent in discussions with the patient and family: 15 min.  Lear Ng    6:47 PM Pt's CT scan which I reviewed myself, per radiologist shows no acute abn's. Gallstones are noted. Will d/c home and instruct close follow up, return for any new concerns.  Suggestion to follow up with PCP and possibly general surgeon discussed with pt and family who voice understanding.  Gavin Pound. [name], MD 05/30/11 Carlis Stable  Gavin Pound. [name], MD 05/30/11 1610

## 2011-05-30 NOTE — ED Provider Notes (Signed)
Pt with onset of right flank that radiates to abdomen Suspect ureteral colic Will move to main ED BP 149/63  Pulse 62  Temp(Src) 97.9 F (36.6 C) (Oral)  Resp 22  SpO2 99%   Joya Gaskins, MD 05/30/11 [phone]

## 2011-12-17 ENCOUNTER — Encounter: Payer: Self-pay | Admitting: Cardiology

## 2011-12-17 ENCOUNTER — Ambulatory Visit (INDEPENDENT_AMBULATORY_CARE_PROVIDER_SITE_OTHER): Payer: Medicare Other | Admitting: Cardiology

## 2011-12-17 VITALS — BP 142/74 | HR 77 | Ht 63.0 in | Wt 223.0 lb

## 2011-12-17 DIAGNOSIS — R079 Chest pain, unspecified: Secondary | ICD-10-CM

## 2011-12-17 DIAGNOSIS — R748 Abnormal levels of other serum enzymes: Secondary | ICD-10-CM

## 2011-12-17 NOTE — Patient Instructions (Addendum)
Your physician wants you to follow-up in: 1 year.   You will receive a reminder letter in the mail two months in advance. If you don't receive a letter, please call our office to schedule the follow-up appointment.  Please follow up with your primary physician concerning your elevated CPK.  Ask your primary physician to refer to Dr Henrietta Hoover office note that was faxed to her today.

## 2011-12-17 NOTE — Progress Notes (Signed)
HPI   Patient is seen for followup of chest pain. She has not had any pain that sounds like cardiac ischemia. I saw her last July, 2012. Historically she says that she had an MI in 2007 elsewhere. We have no records. She says she did have a cath that was okay. She had chest pain in June, 2012. She had a stress echo that was normal.  Historically she has had elevated CPK with normal troponins. I have noted this in the past. I am hopeful That this can be evaluated over time. I feel that this is not a primary cardiac abnormality. We did check another CPK last year he was 566.  Allergies  Allergen Reactions  . Penicillins     unknown    Current Outpatient Prescriptions  Medication Sig Dispense Refill  . acetaminophen (TYLENOL) 325 MG tablet Take 650 mg by mouth every 6 (six) hours as needed.        Marland Kitchen albuterol (PROVENTIL,VENTOLIN) 90 MCG/ACT inhaler Inhale 2 puffs into the lungs every 6 (six) hours as needed. For shortness of breath      . aspirin 81 MG tablet Take 81 mg by mouth daily.        . fish oil-omega-3 fatty acids 1000 MG capsule Take 2 g by mouth daily.        . fluticasone (FLOVENT HFA) 110 MCG/ACT inhaler Inhale 1 puff into the lungs 2 (two) times daily as needed. For shortness of breath      . pravastatin (PRAVACHOL) 20 MG tablet Take 20 mg by mouth daily.        . vitamin C (ASCORBIC ACID) 500 MG tablet Take 500 mg by mouth daily.          History   Social History  . Marital Status: Married    Spouse Name: N/A    Number of Children: N/A  . Years of Education: N/A   Occupational History  . Not on file.   Social History Main Topics  . Smoking status: Never Smoker   . Smokeless tobacco: Not on file  . Alcohol Use: Not on file  . Drug Use: Not on file  . Sexually Active: Not on file   Other Topics Concern  . Not on file   Social History Narrative  . No narrative on file    No family history on file.  Past Medical History  Diagnosis Date  . Hypertension     . Coronary artery disease     MI per patient 2007 elsewhere, no records, pt. says cath  was OK  . Asthma   . Cancer     colon   . Hyperlipidemia   . Chest pain     Hospital 09/2010,  /   Stress echo October 29, 2010, vigorous LV function with stress.  All walls could not be assessed fully but it was felt that there was no ischemia.  . Elevated CPK     Hospital 09/2010,  troponin normal    No past surgical history on file.  ROS   Patient denies fever, chills, headache, sweats, rash, change in vision, change in hearing, chest pain, cough, nausea vomiting, urinary symptoms. All other systems are reviewed and are negative.  PHYSICAL EXAM  Patient is oriented to person time and place. Affect is normal. She is overweight. Lungs are clear. Respiratory effort is nonlabored. Cardiac exam reveals S1 and S2. There no clicks or significant murmurs. The abdomen is soft. There is no  peripheral edema.  Filed Vitals:   12/17/11 1451  BP: 142/74  Pulse: 77  Height: 5\' 3"  (1.6 m)  Weight: 223 lb (101.152 kg)  SpO2: 97%     ASSESSMENT & PLAN

## 2011-12-17 NOTE — Assessment & Plan Note (Signed)
The patient is not having any recurrent chest pain. She had a stress echo July, 2012. This was normal.

## 2011-12-17 NOTE — Assessment & Plan Note (Signed)
The patient had an elevated CPK in the hospital in June, 2012. Troponin was normal. Followup CPK in July, 2012 was 566. I have mentioned this again to the patient. We will be sending a copy of this note to the patient's primary physician. I've asked her to talk with her primary physician about this lab and to see what further workup can be done. Consideration could be given to rheumatology evaluation.

## 2012-01-07 ENCOUNTER — Other Ambulatory Visit (HOSPITAL_COMMUNITY): Payer: Self-pay | Admitting: *Deleted

## 2012-01-07 DIAGNOSIS — Z1231 Encounter for screening mammogram for malignant neoplasm of breast: Secondary | ICD-10-CM

## 2012-01-18 ENCOUNTER — Other Ambulatory Visit: Payer: Self-pay | Admitting: Gastroenterology

## 2012-01-18 DIAGNOSIS — R109 Unspecified abdominal pain: Secondary | ICD-10-CM

## 2012-01-20 ENCOUNTER — Ambulatory Visit (HOSPITAL_COMMUNITY)
Admission: RE | Admit: 2012-01-20 | Discharge: 2012-01-20 | Disposition: A | Discharge status: Home / Self Care | Payer: Medicare Other | Source: Ambulatory Visit | Attending: *Deleted | Admitting: *Deleted

## 2012-01-20 DIAGNOSIS — Z1231 Encounter for screening mammogram for malignant neoplasm of breast: Secondary | ICD-10-CM | POA: Insufficient documentation

## 2012-01-21 ENCOUNTER — Ambulatory Visit
Admission: RE | Admit: 2012-01-21 | Discharge: 2012-01-21 | Disposition: A | Discharge status: Home / Self Care | Payer: Medicare Other | Source: Ambulatory Visit | Attending: Gastroenterology | Admitting: Gastroenterology

## 2012-01-21 DIAGNOSIS — R109 Unspecified abdominal pain: Secondary | ICD-10-CM

## 2012-01-21 MED ORDER — IOHEXOL 300 MG/ML  SOLN
100.0000 mL | Freq: Once | INTRAMUSCULAR | Status: AC | PRN
  Administered 2012-01-21: 100 mL via INTRAVENOUS

## 2013-10-07 ENCOUNTER — Emergency Department (HOSPITAL_COMMUNITY)
Admission: EM | Admit: 2013-10-07 | Discharge: 2013-10-07 | Disposition: A | Discharge status: Home / Self Care | Payer: Medicare Other | Attending: Emergency Medicine | Admitting: Emergency Medicine

## 2013-10-07 ENCOUNTER — Encounter (HOSPITAL_COMMUNITY): Payer: Self-pay | Admitting: Emergency Medicine

## 2013-10-07 DIAGNOSIS — Z79899 Other long term (current) drug therapy: Secondary | ICD-10-CM | POA: Insufficient documentation

## 2013-10-07 DIAGNOSIS — M545 Low back pain, unspecified: Secondary | ICD-10-CM | POA: Insufficient documentation

## 2013-10-07 DIAGNOSIS — Z880 Allergy status to penicillin: Secondary | ICD-10-CM | POA: Insufficient documentation

## 2013-10-07 DIAGNOSIS — M549 Dorsalgia, unspecified: Secondary | ICD-10-CM

## 2013-10-07 DIAGNOSIS — Z7982 Long term (current) use of aspirin: Secondary | ICD-10-CM | POA: Insufficient documentation

## 2013-10-07 DIAGNOSIS — I10 Essential (primary) hypertension: Secondary | ICD-10-CM | POA: Insufficient documentation

## 2013-10-07 DIAGNOSIS — I252 Old myocardial infarction: Secondary | ICD-10-CM | POA: Insufficient documentation

## 2013-10-07 DIAGNOSIS — I2510 Atherosclerotic heart disease of native coronary artery without angina pectoris: Secondary | ICD-10-CM | POA: Insufficient documentation

## 2013-10-07 DIAGNOSIS — E785 Hyperlipidemia, unspecified: Secondary | ICD-10-CM | POA: Insufficient documentation

## 2013-10-07 DIAGNOSIS — J45909 Unspecified asthma, uncomplicated: Secondary | ICD-10-CM | POA: Insufficient documentation

## 2013-10-07 DIAGNOSIS — Z85038 Personal history of other malignant neoplasm of large intestine: Secondary | ICD-10-CM | POA: Insufficient documentation

## 2013-10-07 HISTORY — DX: Acute myocardial infarction, unspecified: I21.9

## 2013-10-07 LAB — URINALYSIS, ROUTINE W REFLEX MICROSCOPIC
BILIRUBIN URINE: NEGATIVE
Glucose, UA: NEGATIVE mg/dL
Hgb urine dipstick: NEGATIVE
Ketones, ur: NEGATIVE mg/dL
LEUKOCYTES UA: NEGATIVE
NITRITE: NEGATIVE
PH: 6.5 (ref 5.0–8.0)
Protein, ur: NEGATIVE mg/dL
SPECIFIC GRAVITY, URINE: 1.019 (ref 1.005–1.030)
UROBILINOGEN UA: 0.2 mg/dL (ref 0.0–1.0)

## 2013-10-07 MED ORDER — HYDROCODONE-ACETAMINOPHEN 5-325 MG PO TABS
1.0000 | ORAL_TABLET | Freq: Once | ORAL | Status: AC
  Administered 2013-10-07: 1 via ORAL
  Filled 2013-10-07: qty 1

## 2013-10-07 MED ORDER — HYDROCODONE-ACETAMINOPHEN 5-325 MG PO TABS: 1.0000 | ORAL_TABLET | Freq: Four times a day (QID) | ORAL | Status: DC | PRN

## 2013-10-07 NOTE — ED Provider Notes (Signed)
Medical screening examination/treatment/procedure(s) were performed by non-physician practitioner and as supervising physician I was immediately available for consultation/collaboration.   EKG Interpretation None        Hoy Morn, MD 10/07/13 1511

## 2013-10-07 NOTE — ED Notes (Signed)
Pt reports back pain x3 days. Pts husband fell and she helped him get up and then started having lower left back pain. Pain increased. Pain 9/10 at present. Ambulatory.

## 2013-10-07 NOTE — Discharge Instructions (Signed)
Back Pain, Adult Low back pain is very common. About 1 in 5 people have back pain.The cause of low back pain is rarely dangerous. The pain often gets better over time.About half of people with a sudden onset of back pain feel better in just 2 weeks. About 8 in 10 people feel better by 6 weeks.  CAUSES Some common causes of back pain include:  Strain of the muscles or ligaments supporting the spine.  Wear and tear (degeneration) of the spinal discs.  Arthritis.  Direct injury to the back. DIAGNOSIS Most of the time, the direct cause of low back pain is not known.However, back pain can be treated effectively even when the exact cause of the pain is unknown.Answering your caregiver's questions about your overall health and symptoms is one of the most accurate ways to make sure the cause of your pain is not dangerous. If your caregiver needs more information, he or she may order lab work or imaging tests (X-rays or MRIs).However, even if imaging tests show changes in your back, this usually does not require surgery. HOME CARE INSTRUCTIONS For many people, back pain returns.Since low back pain is rarely dangerous, it is often a condition that people can learn to manageon their own.   Remain active. It is stressful on the back to sit or stand in one place. Do not sit, drive, or stand in one place for more than 30 minutes at a time. Take short walks on level surfaces as soon as pain allows.Try to increase the length of time you walk each day.  Do not stay in bed.Resting more than 1 or 2 days can delay your recovery.  Do not avoid exercise or work.Your body is made to move.It is not dangerous to be active, even though your back may hurt.Your back will likely heal faster if you return to being active before your pain is gone.  Pay attention to your body when you bend and lift. Many people have less discomfortwhen lifting if they bend their knees, keep the load close to their bodies,and  avoid twisting. Often, the most comfortable positions are those that put less stress on your recovering back.  Find a comfortable position to sleep. Use a firm mattress and lie on your side with your knees slightly bent. If you lie on your back, put a pillow under your knees.  Only take over-the-counter or prescription medicines as directed by your caregiver. Over-the-counter medicines to reduce pain and inflammation are often the most helpful.Your caregiver may prescribe muscle relaxant drugs.These medicines help dull your pain so you can more quickly return to your normal activities and healthy exercise.  Put ice on the injured area.  Put ice in a plastic bag.  Place a towel between your skin and the bag.  Leave the ice on for 15-20 minutes, 03-04 times a day for the first 2 to 3 days. After that, ice and heat may be alternated to reduce pain and spasms.  Ask your caregiver about trying back exercises and gentle massage. This may be of some benefit.  Avoid feeling anxious or stressed.Stress increases muscle tension and can worsen back pain.It is important to recognize when you are anxious or stressed and learn ways to manage it.Exercise is a great option. SEEK MEDICAL CARE IF:  You have pain that is not relieved with rest or medicine.  You have pain that does not improve in 1 week.  You have new symptoms.  You are generally not feeling well. SEEK   IMMEDIATE MEDICAL CARE IF:   You have pain that radiates from your back into your legs.  You develop new bowel or bladder control problems.  You have unusual weakness or numbness in your arms or legs.  You develop nausea or vomiting.  You develop abdominal pain.  You feel faint. Document Released: 04/12/2005 Document Revised: 10/12/2011 Document Reviewed: 08/31/2010 [employer] Patient Information 2014 [employer].  

## 2013-10-07 NOTE — ED Provider Notes (Signed)
CSN: [id number]     Arrival date & time 10/07/13  7829 History   First MD Initiated Contact with Patient 10/07/13 1005     Chief Complaint  Patient presents with  . Back Pain     (Consider location/radiation/quality/duration/timing/severity/associated sxs/prior Treatment) HPI Comments: States that she lifted her husband prior to it falling. States that she has had some frequency  Patient is a 63 y.o. female presenting with back pain. The history is provided by the patient. No language interpreter was used.  Back Pain Location:  Lumbar spine Quality:  Aching Radiates to:  Does not radiate Pain severity:  Moderate Pain is:  Same all the time Onset quality:  Sudden Timing:  Constant Progression:  Worsening Relieved by:  NSAIDs Associated symptoms: no abdominal pain, no numbness, no perianal numbness, no tingling and no weakness     Past Medical History  Diagnosis Date  . Hypertension   . Coronary artery disease     MI per patient 2007 elsewhere, no records, pt. says cath  was OK  . Asthma   . Cancer     colon   . Hyperlipidemia   . Chest pain     Hospital 09/2010,  /   Stress echo October 29, 2010, vigorous LV function with stress.  All walls could not be assessed fully but it was felt that there was no ischemia.  . Elevated CPK     Hospital 09/2010,  troponin normal  . MI (myocardial infarction)     2007   Past Surgical History  Procedure Laterality Date  . Colon surgery      2004  . Cesarean section    . Total hip arthroplasty      right hip 2007  . Abdominal hysterectomy    . Hernia repair      three total, 2007  . Shoulder surgery      left shoulder 2008  . Foot surgery      left 1999   History reviewed. No pertinent family history. History  Substance Use Topics  . Smoking status: Never Smoker   . Smokeless tobacco: Not on file  . Alcohol Use: No   OB History   Grav Para Term Preterm Abortions TAB SAB Ect Mult Living                 Review of Systems   Respiratory: Negative.   Cardiovascular: Negative.   Gastrointestinal: Negative for abdominal pain.  Musculoskeletal: Positive for back pain.  Neurological: Negative for tingling, weakness and numbness.      Allergies  Penicillins  Home Medications   Prior to Admission medications   Medication Sig Start Date End Date Taking? Authorizing Provider  acetaminophen (TYLENOL) 325 MG tablet Take 650 mg by mouth every 6 (six) hours as needed.      Historical Provider, MD  albuterol (PROVENTIL,VENTOLIN) 90 MCG/ACT inhaler Inhale 2 puffs into the lungs every 6 (six) hours as needed. For shortness of breath    Historical Provider, MD  aspirin 81 MG tablet Take 81 mg by mouth daily.      Historical Provider, MD  fish oil-omega-3 fatty acids 1000 MG capsule Take 2 g by mouth daily.      Historical Provider, MD  fluticasone (FLOVENT HFA) 110 MCG/ACT inhaler Inhale 1 puff into the lungs 2 (two) times daily as needed. For shortness of breath    Historical Provider, MD  pravastatin (PRAVACHOL) 20 MG tablet Take 20 mg by mouth daily.  Historical Provider, MD  vitamin C (ASCORBIC ACID) 500 MG tablet Take 500 mg by mouth daily.      Historical Provider, MD   BP 171/80  Pulse 63  Temp(Src) 98.7 F (37.1 C) (Oral)  Resp 16  SpO2 99% Physical Exam  Nursing note and vitals reviewed. Constitutional: She appears well-developed and well-nourished.  Cardiovascular: Normal rate and regular rhythm.   Pulmonary/Chest: Effort normal and breath sounds normal.  Musculoskeletal: Normal range of motion.  Left lumbar paraspinal tenderness. Moving all extremities without any problem. Equal strength in legs bilaterally  Neurological: She is alert. She exhibits normal muscle tone. Coordination normal.  Skin: Skin is warm.    ED Course  Procedures (including critical care time) Labs Review Labs Reviewed  URINALYSIS, ROUTINE W REFLEX MICROSCOPIC - Abnormal; Notable for the following:    APPearance CLOUDY  (*)    All other components within normal limits    Imaging Review No results found.   EKG Interpretation None      MDM   Final diagnoses:  Back pain    Pt is neurovascularly intact. Pt has no red flags. No infection noted in urine. Will treat symptomatically with vicodin for pain. Likely strain related to lifting    Glendell Docker, NP 10/07/13 1117

## 2013-11-29 ENCOUNTER — Ambulatory Visit: Payer: Self-pay | Admitting: Gynecology

## 2013-12-26 ENCOUNTER — Other Ambulatory Visit (HOSPITAL_COMMUNITY)
Admission: RE | Admit: 2013-12-26 | Discharge: 2013-12-26 | Disposition: A | Discharge status: Home / Self Care | Payer: Medicare Other | Source: Ambulatory Visit | Attending: Gynecology | Admitting: Gynecology

## 2013-12-26 ENCOUNTER — Telehealth: Payer: Self-pay | Admitting: *Deleted

## 2013-12-26 ENCOUNTER — Encounter: Payer: Self-pay | Admitting: Gynecology

## 2013-12-26 ENCOUNTER — Ambulatory Visit (INDEPENDENT_AMBULATORY_CARE_PROVIDER_SITE_OTHER): Payer: Medicare Other | Admitting: Gynecology

## 2013-12-26 VITALS — BP 124/80 | Ht 64.0 in | Wt 220.0 lb

## 2013-12-26 DIAGNOSIS — N898 Other specified noninflammatory disorders of vagina: Secondary | ICD-10-CM

## 2013-12-26 DIAGNOSIS — Z124 Encounter for screening for malignant neoplasm of cervix: Secondary | ICD-10-CM | POA: Insufficient documentation

## 2013-12-26 DIAGNOSIS — N644 Mastodynia: Secondary | ICD-10-CM

## 2013-12-26 DIAGNOSIS — N952 Postmenopausal atrophic vaginitis: Secondary | ICD-10-CM

## 2013-12-26 DIAGNOSIS — Z1272 Encounter for screening for malignant neoplasm of vagina: Secondary | ICD-10-CM

## 2013-12-26 LAB — WET PREP FOR TRICH, YEAST, CLUE
TRICH WET PREP: NONE SEEN
Yeast Wet Prep HPF POC: NONE SEEN

## 2013-12-26 MED ORDER — METRONIDAZOLE 500 MG PO TABS: 500.0000 mg | ORAL_TABLET | Freq: Two times a day (BID) | ORAL | Status: DC

## 2013-12-26 NOTE — Telephone Encounter (Signed)
Orders placed at breast center they will contact pt to schedule. 

## 2013-12-26 NOTE — Telephone Encounter (Signed)
Message copied by Thamas Jaegers on Wed Dec 26, 2013 11:51 AM ------      Message from: Anastasio Auerbach      Created: Wed Dec 26, 2013 11:14 AM       Schedule diagnostic mammography and ultrasound at [facility] reference tenderness left breast 9:00 position with patient reported mass. Physician exam is negative. ------

## 2013-12-26 NOTE — Progress Notes (Addendum)
Angela Patton 1951/04/06 [id number]        63 y.o.  G3P3 presents with several issues as noted below. Has not been in the office are over 3 years.  Past medical history,surgical history, problem list, medications, allergies, family history and social history were all reviewed and documented as reviewed in the EPIC chart.  ROS:  12 system ROS performed with pertinent positives and negatives included in the history, assessment and plan.   Additional significant findings :  None   Exam: Kim Counsellor Vitals:   64/02/15 1019  BP: 124/80  Height: 5\' 4"  (1.626 m)  Weight: 220 lb (99.791 kg)   General appearance:  Normal affect, orientation and appearance. Skin: Grossly normal HEENT: Without gross lesions.  No cervical or supraclavicular adenopathy. Thyroid normal.  Lungs:  Clear without wheezing, rales or rhonchi Cardiac: RR, without RMG Abdominal:  Soft, nontender, without masses, guarding, rebound, organomegaly or hernia Breasts:  Examined lying and sitting without masses, retractions, discharge or axillary adenopathy. Pelvic:  Ext/BUS/vagina with atrophic changes. White discharge noted. Pap of cuff done  Bimanual without gross masses or tenderness  Anus and perineum  Normal   Rectovaginal  Normal sphincter tone without palpated masses or tenderness.    Assessment/Plan:  63 y.o. G3P3 female with the following issues.   1. Vaginal odor. Patient notes on and off vaginal odor. Thinks that she has a fistula. Does not have any discharge per her history or any discoloration coming from the vagina but bases this on over a long period of time she had the symptoms. Exam shows no evidence of fistula with white discharge. Wet prep suggests low-grade bacterial vaginosis. We'll treat with Flagyl 500 mg twice a day x7 days followup if symptoms persist, worsen or recurs. Alcohol avoidance reviewed. 2. Mastalgia/questionable left breast mass. Patient notes over the past several months some  discomfort in her left breast at the 9:00 position several fingers off the areola. She said that sometimes she feels like she can feel a mass but not always. Her exam today is normal. Will start with diagnostic mammography and ultrasound and patient knows that we will help her arrange this and to followup for this. 3. Postmenopausal status post TAH 2005 at the time of her colon surgery. Patient also believes they removed her ovaries. Without significant symptoms of hot flashes night sweats vaginal dryness. Is not sexually active. Will continue to monitor. 4. Pap smear 2011. Pap of cuff done today due to lack of past reports. No history of abnormal Pap smears by the patient. 5. DEXA 2012 normal. Plan repeat at 5 year interval. Increase calcium/vitamin D reviewed. 6. Colonoscopy 2014. Followup at their recommended interval with history of colon cancer. 7. Health maintenance. No routine blood work done as she has this done through her primary physician's office. Followup for mammogram/ultrasound otherwise annually, sooner if any issues.   Note: This document was prepared with digital dictation and possible smart phrase technology. Any transcriptional errors that result from this process are unintentional.   Anastasio Auerbach MD, 10:57 AM 12/26/2013

## 2013-12-26 NOTE — Patient Instructions (Signed)
Take Flagyl medication twice daily for 7 days. Avoid alcohol while taking. Followup if vaginal odor continues or returns.  Office will contact you to arrange mammogram and ultrasound of the breast. Call if you do not hear from our office within several days.  You may obtain a copy of any labs that were done today by logging onto MyChart as outlined in the instructions provided with your AVS (after visit summary). The office will not call with normal lab results but certainly if there are any significant abnormalities then we will contact you.   Health Maintenance, Female A healthy lifestyle and preventative care can promote health and wellness.  Maintain regular health, dental, and eye exams.  Eat a healthy diet. Foods like vegetables, fruits, whole grains, low-fat dairy products, and lean protein foods contain the nutrients you need without too many calories. Decrease your intake of foods high in solid fats, added sugars, and salt. Get information about a proper diet from your caregiver, if necessary.  Regular physical exercise is one of the most important things you can do for your health. Most adults should get at least 150 minutes of moderate-intensity exercise (any activity that increases your heart rate and causes you to sweat) each week. In addition, most adults need muscle-strengthening exercises on 2 or more days a week.   Maintain a healthy weight. The body mass index (BMI) is a screening tool to identify possible weight problems. It provides an estimate of body fat based on height and weight. Your caregiver can help determine your BMI, and can help you achieve or maintain a healthy weight. For adults 20 years and older:  A BMI below 18.5 is considered underweight.  A BMI of 18.5 to 24.9 is normal.  A BMI of 25 to 29.9 is considered overweight.  A BMI of 30 and above is considered obese.  Maintain normal blood lipids and cholesterol by exercising and minimizing your intake of  saturated fat. Eat a balanced diet with plenty of fruits and vegetables. Blood tests for lipids and cholesterol should begin at age 34 and be repeated every 5 years. If your lipid or cholesterol levels are high, you are over 50, or you are a high risk for heart disease, you may need your cholesterol levels checked more frequently.Ongoing high lipid and cholesterol levels should be treated with medicines if diet and exercise are not effective.  If you smoke, find out from your caregiver how to quit. If you do not use tobacco, do not start.  Lung cancer screening is recommended for adults aged 67 80 years who are at high risk for developing lung cancer because of a history of smoking. Yearly low-dose computed tomography (CT) is recommended for people who have at least a 30-pack-year history of smoking and are a current smoker or have quit within the past 15 years. A pack year of smoking is smoking an average of 1 pack of cigarettes a day for 1 year (for example: 1 pack a day for 30 years or 2 packs a day for 15 years). Yearly screening should continue until the smoker has stopped smoking for at least 15 years. Yearly screening should also be stopped for people who develop a health problem that would prevent them from having lung cancer treatment.  If you are pregnant, do not drink alcohol. If you are breastfeeding, be very cautious about drinking alcohol. If you are not pregnant and choose to drink alcohol, do not exceed 1 drink per day. One drink is  considered to be 12 ounces (355 mL) of beer, 5 ounces (148 mL) of wine, or 1.5 ounces (44 mL) of liquor.  Avoid use of street drugs. Do not share needles with anyone. Ask for help if you need support or instructions about stopping the use of drugs.  High blood pressure causes heart disease and increases the risk of stroke. Blood pressure should be checked at least every 1 to 2 years. Ongoing high blood pressure should be treated with medicines, if weight loss  and exercise are not effective.  If you are 62 to 64 years old, ask your caregiver if you should take aspirin to prevent strokes.  Diabetes screening involves taking a blood sample to check your fasting blood sugar level. This should be done once every 3 years, after age 63, if you are within normal weight and without risk factors for diabetes. Testing should be considered at a younger age or be carried out more frequently if you are overweight and have at least 1 risk factor for diabetes.  Breast cancer screening is essential preventative care for women. You should practice "breast self-awareness." This means understanding the normal appearance and feel of your breasts and may include breast self-examination. Any changes detected, no matter how small, should be reported to a caregiver. Women in their 74s and 30s should have a clinical breast exam (CBE) by a caregiver as part of a regular health exam every 1 to 3 years. After age 61, women should have a CBE every year. Starting at age 29, women should consider having a mammogram (breast X-ray) every year. Women who have a family history of breast cancer should talk to their caregiver about genetic screening. Women at a high risk of breast cancer should talk to their caregiver about having an MRI and a mammogram every year.  Breast cancer gene (BRCA)-related cancer risk assessment is recommended for women who have family members with BRCA-related cancers. BRCA-related cancers include breast, ovarian, tubal, and peritoneal cancers. Having family members with these cancers may be associated with an increased risk for harmful changes (mutations) in the breast cancer genes BRCA1 and BRCA2. Results of the assessment will determine the need for genetic counseling and BRCA1 and BRCA2 testing.  The Pap test is a screening test for cervical cancer. Women should have a Pap test starting at age 29. Between ages 88 and 26, Pap tests should be repeated every 2 years.  Beginning at age 32, you should have a Pap test every 3 years as long as the past 3 Pap tests have been normal. If you had a hysterectomy for a problem that was not cancer or a condition that could lead to cancer, then you no longer need Pap tests. If you are between ages 41 and 52, and you have had normal Pap tests going back 10 years, you no longer need Pap tests. If you have had past treatment for cervical cancer or a condition that could lead to cancer, you need Pap tests and screening for cancer for at least 20 years after your treatment. If Pap tests have been discontinued, risk factors (such as a new sexual partner) need to be reassessed to determine if screening should be resumed. Some women have medical problems that increase the chance of getting cervical cancer. In these cases, your caregiver may recommend more frequent screening and Pap tests.  The human papillomavirus (HPV) test is an additional test that may be used for cervical cancer screening. The HPV test looks for the  virus that can cause the cell changes on the cervix. The cells collected during the Pap test can be tested for HPV. The HPV test could be used to screen women aged 26 years and older, and should be used in women of any age who have unclear Pap test results. After the age of 54, women should have HPV testing at the same frequency as a Pap test.  Colorectal cancer can be detected and often prevented. Most routine colorectal cancer screening begins at the age of 29 and continues through age 86. However, your caregiver may recommend screening at an earlier age if you have risk factors for colon cancer. On a yearly basis, your caregiver may provide home test kits to check for hidden blood in the stool. Use of a small camera at the end of a tube, to directly examine the colon (sigmoidoscopy or colonoscopy), can detect the earliest forms of colorectal cancer. Talk to your caregiver about this at age 33, when routine screening begins.  Direct examination of the colon should be repeated every 5 to 10 years through age 59, unless early forms of pre-cancerous polyps or small growths are found.  Hepatitis C blood testing is recommended for all people born from [birth year] through 1966 and any individual with known risks for hepatitis C.  Practice safe sex. Use condoms and avoid high-risk sexual practices to reduce the spread of sexually transmitted infections (STIs). Sexually active women aged 84 and younger should be checked for Chlamydia, which is a common sexually transmitted infection. Older women with new or multiple partners should also be tested for Chlamydia. Testing for other STIs is recommended if you are sexually active and at increased risk.  Osteoporosis is a disease in which the bones lose minerals and strength with aging. This can result in serious bone fractures. The risk of osteoporosis can be identified using a bone density scan. Women ages 27 and over and women at risk for fractures or osteoporosis should discuss screening with their caregivers. Ask your caregiver whether you should be taking a calcium supplement or vitamin D to reduce the rate of osteoporosis.  Menopause can be associated with physical symptoms and risks. Hormone replacement therapy is available to decrease symptoms and risks. You should talk to your caregiver about whether hormone replacement therapy is right for you.  Use sunscreen. Apply sunscreen liberally and repeatedly throughout the day. You should seek shade when your shadow is shorter than you. Protect yourself by wearing long sleeves, pants, a wide-brimmed hat, and sunglasses year round, whenever you are outdoors.  Notify your caregiver of new moles or changes in moles, especially if there is a change in shape or color. Also notify your caregiver if a mole is larger than the size of a pencil eraser.  Stay current with your immunizations. Document Released: 10/26/2010 Document Revised: 08/07/2012  Document Reviewed: 10/26/2010 [facility] Patient Information 2014 [place].

## 2013-12-27 ENCOUNTER — Encounter: Payer: Self-pay | Admitting: Gynecology

## 2013-12-27 LAB — URINALYSIS W MICROSCOPIC + REFLEX CULTURE
BACTERIA UA: NONE SEEN
BILIRUBIN URINE: NEGATIVE
Casts: NONE SEEN
Glucose, UA: NEGATIVE mg/dL
Hgb urine dipstick: NEGATIVE
KETONES UR: NEGATIVE mg/dL
Leukocytes, UA: NEGATIVE
NITRITE: NEGATIVE
PROTEIN: NEGATIVE mg/dL
Specific Gravity, Urine: 1.028 (ref 1.005–1.030)
UROBILINOGEN UA: 0.2 mg/dL (ref 0.0–1.0)
pH: 5.5 (ref 5.0–8.0)

## 2013-12-27 LAB — CYTOLOGY - PAP

## 2014-01-01 NOTE — Telephone Encounter (Signed)
APPOINTMENT 01/07/14 @ 8:30 AM

## 2014-01-07 ENCOUNTER — Ambulatory Visit
Admission: RE | Admit: 2014-01-07 | Discharge: 2014-01-07 | Disposition: A | Discharge status: Home / Self Care | Payer: Medicare Other | Source: Ambulatory Visit | Attending: Gynecology | Admitting: Gynecology

## 2014-01-07 DIAGNOSIS — N644 Mastodynia: Secondary | ICD-10-CM

## 2014-01-31 ENCOUNTER — Ambulatory Visit (INDEPENDENT_AMBULATORY_CARE_PROVIDER_SITE_OTHER): Payer: Medicare Other | Admitting: Gynecology

## 2014-01-31 ENCOUNTER — Encounter: Payer: Self-pay | Admitting: Gynecology

## 2014-01-31 DIAGNOSIS — N898 Other specified noninflammatory disorders of vagina: Secondary | ICD-10-CM

## 2014-01-31 DIAGNOSIS — R0789 Other chest pain: Secondary | ICD-10-CM

## 2014-01-31 LAB — WET PREP FOR TRICH, YEAST, CLUE
Clue Cells Wet Prep HPF POC: NONE SEEN
TRICH WET PREP: NONE SEEN
Yeast Wet Prep HPF POC: NONE SEEN

## 2014-01-31 NOTE — Patient Instructions (Signed)
Take the Flagyl medication twice daily for 7 days. Avoid alcohol while taking. Office will call you to arrange the ultrasound and x-rays.

## 2014-01-31 NOTE — Progress Notes (Signed)
Angela Patton 1950/09/22 [id number]        63 y.o.  G3P3 presents with 2 issues:  1. Return of her vaginal infection. Patient was treated for bacterial vaginosis beginning of September with Flagyl. Was primarily based on vaginal odor. Her symptoms resolved but now they seem to have recurred last week with primarily a vaginal discharge with odor. No urinary symptoms such as frequency dysuria or urgency. 2. Patient notes pain under her left arm over the last several weeks. She was evaluated for pain on the medial portion of her right breast at 9:00 with a negative ultrasound and mammogram. She had a bilateral diagnostic mammogram which was normal. She now notes pain at the 3:00 position underneath her axilla along her rib cage. No respiratory issues as far as cough, sputum, pain with deep inspiration..  Past medical history,surgical history, problem list, medications, allergies, family history and social history were all reviewed and documented in the EPIC chart.  Directed ROS with pertinent positives and negatives documented in the history of present illness/assessment and plan.  Exam: Kim assistant General appearance:  Normal Both breast examined lying and sitting without masses retractions discharge adenopathy. The area that she is referring to is outside the breast tissue right below her left axilla. There is no skin changes or palpable abnormalities. Lungs are clear overlying this area. Pelvic external BUS vagina with atrophic changes. White discharge noted.  Assessment/Plan:  63 y.o. G3P3 with:  1. White discharge. Wet prep is unremarkable. Given the history I am going to retreat her as a bacterial vaginosis with Flagyl 500 mg twice a day x7 days. Alcohol avoidance reviewed. Follow up if her symptoms persist, worsen or recur. 2. Left chest wall pain. We'll plan ultrasound of this area and chest x-ray with rib views  For now. Heat to the area and nonsteroidal  anti-inflammatory     Anastasio Auerbach MD, 4:20 PM 01/31/2014

## 2014-02-01 ENCOUNTER — Other Ambulatory Visit: Payer: Self-pay | Admitting: Gynecology

## 2014-02-01 ENCOUNTER — Ambulatory Visit (HOSPITAL_COMMUNITY)
Admission: RE | Admit: 2014-02-01 | Discharge: 2014-02-01 | Disposition: A | Discharge status: Home / Self Care | Payer: Medicare Other | Source: Ambulatory Visit | Attending: Gynecology | Admitting: Gynecology

## 2014-02-01 ENCOUNTER — Encounter (INDEPENDENT_AMBULATORY_CARE_PROVIDER_SITE_OTHER): Payer: Self-pay

## 2014-02-01 ENCOUNTER — Telehealth: Payer: Self-pay | Admitting: *Deleted

## 2014-02-01 DIAGNOSIS — N644 Mastodynia: Secondary | ICD-10-CM

## 2014-02-01 DIAGNOSIS — R0789 Other chest pain: Secondary | ICD-10-CM | POA: Diagnosis not present

## 2014-02-01 DIAGNOSIS — J45909 Unspecified asthma, uncomplicated: Secondary | ICD-10-CM | POA: Diagnosis not present

## 2014-02-01 DIAGNOSIS — I252 Old myocardial infarction: Secondary | ICD-10-CM | POA: Insufficient documentation

## 2014-02-01 DIAGNOSIS — M79622 Pain in left upper arm: Secondary | ICD-10-CM

## 2014-02-01 MED ORDER — METRONIDAZOLE 500 MG PO TABS: 500.0000 mg | ORAL_TABLET | Freq: Two times a day (BID) | ORAL | Status: DC

## 2014-02-01 NOTE — Telephone Encounter (Signed)
Appointment on 02/08/14 at breast center.

## 2014-02-01 NOTE — Telephone Encounter (Signed)
Left on pt voicemail that orders placed for breast center they will contact her or she can call. The x-ray hours are walk in from 8am- 4:30pm only at [facility] hospital not appointment needed.

## 2014-02-01 NOTE — Telephone Encounter (Signed)
Message copied by Thamas Jaegers on Fri Feb 01, 2014  9:27 AM ------      Message from: Anastasio Auerbach      Created: Thu Jan 31, 2014  4:24 PM       Schedule:      #1  Ultrasound left breast 3:00 lateral position below axillary region reference new onset of pain. Recently had an ultrasound at the 9:00 position of the left breast but this is a different area.      #2  Chest x-ray PA and lateral as well as rib view to show area of discomfort below left axillary region reference new onset chest wall pain ------

## 2014-02-06 NOTE — Telephone Encounter (Signed)
Pt had chest x-ray 02/01/14 @ 3:15pm

## 2014-02-08 ENCOUNTER — Ambulatory Visit
Admission: RE | Admit: 2014-02-08 | Discharge: 2014-02-08 | Disposition: A | Discharge status: Home / Self Care | Payer: Medicare Other | Source: Ambulatory Visit | Attending: Gynecology | Admitting: Gynecology

## 2014-02-08 DIAGNOSIS — N644 Mastodynia: Secondary | ICD-10-CM

## 2014-02-25 ENCOUNTER — Encounter: Payer: Self-pay | Admitting: Gynecology

## 2014-02-27 ENCOUNTER — Encounter: Payer: Self-pay | Admitting: Internal Medicine

## 2014-04-22 ENCOUNTER — Ambulatory Visit (INDEPENDENT_AMBULATORY_CARE_PROVIDER_SITE_OTHER): Payer: Medicare Other | Admitting: Gynecology

## 2014-04-22 ENCOUNTER — Encounter: Payer: Self-pay | Admitting: Gynecology

## 2014-04-22 DIAGNOSIS — N9489 Other specified conditions associated with female genital organs and menstrual cycle: Secondary | ICD-10-CM

## 2014-04-22 DIAGNOSIS — N898 Other specified noninflammatory disorders of vagina: Secondary | ICD-10-CM

## 2014-04-22 DIAGNOSIS — L539 Erythematous condition, unspecified: Secondary | ICD-10-CM

## 2014-04-22 MED ORDER — METRONIDAZOLE 500 MG PO TABS: 500.0000 mg | ORAL_TABLET | Freq: Two times a day (BID) | ORAL | Status: DC

## 2014-04-22 NOTE — Patient Instructions (Signed)
Follow up if the red area on the breast persists.

## 2014-04-22 NOTE — Progress Notes (Signed)
Angela Patton 1950-09-07 [id number]        63 y.o.  G3P3 Presents with 2 issues:  1. Return of vaginal odor that she had previously. Was treated with Flagyl 500 mg twice a day 7 days with resolution of her symptoms but said that now she is having the vaginal odor again. No discharge or other symptoms such as frequency dysuria urgency. 2. Small red area on her left breast and she's noticed for last month or so. No drainage. No tenderness but just a red area.  Past medical history,surgical history, problem list, medications, allergies, family history and social history were all reviewed and documented in the EPIC chart.  Directed ROS with pertinent positives and negatives documented in the history of present illness/assessment and plan.  Exam: Copywriter, advertising General appearance:  Normal Both breasts examined lying and sitting without masses retractions discharge adenopathy. The area she's pointing to in the left breast is approximately 10:00 to finger breast off the areola. She has prominent pores over both breasts in the area she's point to appears to be a plugged pore. There is no palpable abnormalities or sebaceous cyst.  Assessment/Plan:  63 y.o. G3P3  With:  1. Recurrent vaginal odor. Treat with Flagyl 500 mg twice a day 7 days, alcohol avoidance. Refill 1 provided. Follow up if her symptoms persist worsen or recur. 2. Self perceived left breast erythema. Exam overall is normal. Recent mammogram/ultrasound the left breast negative October 2015. Recommend patient observe at present. If it resolves she'll follow if it persists or worsens she knows importance of reexamination and follow up with me.     Anastasio Auerbach MD, 3:29 PM 04/22/2014

## 2014-07-03 ENCOUNTER — Encounter (HOSPITAL_COMMUNITY): Payer: Self-pay

## 2014-07-03 ENCOUNTER — Emergency Department (HOSPITAL_COMMUNITY)
Admission: EM | Admit: 2014-07-03 | Discharge: 2014-07-04 | Disposition: A | Discharge status: Home / Self Care | Payer: Medicare Other | Attending: Emergency Medicine | Admitting: Emergency Medicine

## 2014-07-03 ENCOUNTER — Emergency Department (HOSPITAL_COMMUNITY): Payer: Medicare Other

## 2014-07-03 DIAGNOSIS — Y9301 Activity, walking, marching and hiking: Secondary | ICD-10-CM | POA: Insufficient documentation

## 2014-07-03 DIAGNOSIS — J45909 Unspecified asthma, uncomplicated: Secondary | ICD-10-CM | POA: Insufficient documentation

## 2014-07-03 DIAGNOSIS — Y998 Other external cause status: Secondary | ICD-10-CM | POA: Insufficient documentation

## 2014-07-03 DIAGNOSIS — Y9289 Other specified places as the place of occurrence of the external cause: Secondary | ICD-10-CM | POA: Diagnosis not present

## 2014-07-03 DIAGNOSIS — Z792 Long term (current) use of antibiotics: Secondary | ICD-10-CM | POA: Diagnosis not present

## 2014-07-03 DIAGNOSIS — S8991XA Unspecified injury of right lower leg, initial encounter: Secondary | ICD-10-CM | POA: Insufficient documentation

## 2014-07-03 DIAGNOSIS — Z8639 Personal history of other endocrine, nutritional and metabolic disease: Secondary | ICD-10-CM | POA: Insufficient documentation

## 2014-07-03 DIAGNOSIS — Z7982 Long term (current) use of aspirin: Secondary | ICD-10-CM | POA: Insufficient documentation

## 2014-07-03 DIAGNOSIS — Z23 Encounter for immunization: Secondary | ICD-10-CM | POA: Diagnosis not present

## 2014-07-03 DIAGNOSIS — S4991XA Unspecified injury of right shoulder and upper arm, initial encounter: Secondary | ICD-10-CM | POA: Insufficient documentation

## 2014-07-03 DIAGNOSIS — I2510 Atherosclerotic heart disease of native coronary artery without angina pectoris: Secondary | ICD-10-CM | POA: Insufficient documentation

## 2014-07-03 DIAGNOSIS — W19XXXA Unspecified fall, initial encounter: Secondary | ICD-10-CM

## 2014-07-03 DIAGNOSIS — Z880 Allergy status to penicillin: Secondary | ICD-10-CM | POA: Diagnosis not present

## 2014-07-03 DIAGNOSIS — T07XXXA Unspecified multiple injuries, initial encounter: Secondary | ICD-10-CM

## 2014-07-03 DIAGNOSIS — I252 Old myocardial infarction: Secondary | ICD-10-CM | POA: Diagnosis not present

## 2014-07-03 DIAGNOSIS — Z85038 Personal history of other malignant neoplasm of large intestine: Secondary | ICD-10-CM | POA: Insufficient documentation

## 2014-07-03 DIAGNOSIS — I10 Essential (primary) hypertension: Secondary | ICD-10-CM | POA: Insufficient documentation

## 2014-07-03 DIAGNOSIS — T148 Other injury of unspecified body region: Secondary | ICD-10-CM | POA: Insufficient documentation

## 2014-07-03 DIAGNOSIS — W172XXA Fall into hole, initial encounter: Secondary | ICD-10-CM | POA: Insufficient documentation

## 2014-07-03 NOTE — ED Notes (Signed)
Pt was at her sisters funeral and was walking to the burial and fell in a hole in the ground on her right side, she complains of right knee pain and shoulder pain

## 2014-07-04 ENCOUNTER — Encounter (HOSPITAL_COMMUNITY): Payer: Self-pay | Admitting: Emergency Medicine

## 2014-07-04 MED ORDER — TRAMADOL HCL 50 MG PO TABS
50.0000 mg | ORAL_TABLET | Freq: Once | ORAL | Status: AC
  Administered 2014-07-04: 50 mg via ORAL
  Filled 2014-07-04: qty 1

## 2014-07-04 MED ORDER — NAPROXEN 375 MG PO TABS: 375.0000 mg | ORAL_TABLET | Freq: Two times a day (BID) | ORAL | Status: DC

## 2014-07-04 MED ORDER — IBUPROFEN 800 MG PO TABS
800.0000 mg | ORAL_TABLET | Freq: Once | ORAL | Status: AC
  Administered 2014-07-04: 800 mg via ORAL
  Filled 2014-07-04: qty 1

## 2014-07-04 MED ORDER — TRAMADOL HCL 50 MG PO TABS: 50.0000 mg | ORAL_TABLET | Freq: Four times a day (QID) | ORAL | Status: DC | PRN

## 2014-07-04 MED ORDER — TETANUS-DIPHTH-ACELL PERTUSSIS 5-2.5-18.5 LF-MCG/0.5 IM SUSP
0.5000 mL | Freq: Once | INTRAMUSCULAR | Status: AC
  Administered 2014-07-04: 0.5 mL via INTRAMUSCULAR
  Filled 2014-07-04: qty 0.5

## 2014-07-04 NOTE — ED Provider Notes (Signed)
CSN: [id number]     Arrival date & time 07/03/14  2243 History   First MD Initiated Contact with Patient 07/04/14 0050     Chief Complaint  Patient presents with  . Fall  . Knee Pain     (Consider location/radiation/quality/duration/timing/severity/associated sxs/prior Treatment) Patient is a 64 y.o. female presenting with fall. The history is provided by the patient.  Fall This is a new problem. The current episode started 12 to 24 hours ago. The problem occurs constantly. The problem has not changed since onset.Pertinent negatives include no chest pain, no abdominal pain, no headaches and no shortness of breath. Nothing aggravates the symptoms. Nothing relieves the symptoms. She has tried nothing for the symptoms. The treatment provided no relief.  [place] in a hole at a funeral landing on right knee and shoulder.  No meds taken  Past Medical History  Diagnosis Date  . Hypertension   . Coronary artery disease     MI per patient 2007 elsewhere, no records, pt. says cath  was OK  . Asthma   . Cancer     colon   . Hyperlipidemia   . Chest pain     Hospital 09/2010,  /   Stress echo October 29, 2010, vigorous LV function with stress.  All walls could not be assessed fully but it was felt that there was no ischemia.  . Elevated CPK     Hospital 09/2010,  troponin normal  . MI (myocardial infarction)     2007   Past Surgical History  Procedure Laterality Date  . Colon surgery      2004  . Cesarean section    . Total hip arthroplasty      right hip 2007  . Abdominal hysterectomy    . Hernia repair      three total, 2007  . Shoulder surgery      left shoulder 2008  . Foot surgery      left 1999   Family History  Problem Relation Age of Onset  . Cancer Mother     Bone  . Cancer Father     Colon  . Diabetes Sister   . Cancer Brother     Hodgkins  . Cancer Brother     Stomach  . Diabetes Sister   . Diabetes Sister    History  Substance Use Topics  . Smoking status: Never  Smoker   . Smokeless tobacco: Not on file  . Alcohol Use: Yes     Comment: Rare   OB History    Gravida Para Term Preterm AB TAB SAB Ectopic Multiple Living   3 3        3      Review of Systems  Respiratory: Negative for shortness of breath.   Cardiovascular: Negative for chest pain.  Gastrointestinal: Negative for abdominal pain.  Neurological: Negative for headaches.  All other systems reviewed and are negative.     Allergies  Penicillins  Home Medications   Prior to Admission medications   Medication Sig Start Date End Date Taking? Authorizing Provider  aspirin 81 MG tablet Take 81 mg by mouth daily.   Yes Historical Provider, MD  HYDROcodone-acetaminophen (NORCO/VICODIN) 5-325 MG per tablet Take 1-2 tablets by mouth every 6 (six) hours as needed. Patient taking differently: Take 1-2 tablets by mouth every 6 (six) hours as needed for moderate pain.  10/07/13  Yes Glendell Docker, NP  metroNIDAZOLE (FLAGYL) 500 MG tablet Take 1 tablet (500 mg total) by  mouth 2 (two) times daily. Patient not taking: Reported on 04/22/2014 02/01/14   Anastasio Auerbach, MD  metroNIDAZOLE (FLAGYL) 500 MG tablet Take 1 tablet (500 mg total) by mouth 2 (two) times daily. For 7 days.  Avoid alcohol while taking Patient not taking: Reported on 07/04/2014 04/22/14   Anastasio Auerbach, MD   BP 139/65 mmHg  Pulse 77  Temp(Src) 98.1 F (36.7 C) (Oral)  Resp 20  SpO2 99% Physical Exam  Constitutional: She is oriented to person, place, and time. She appears well-developed and well-nourished. No distress.  HENT:  Head: Normocephalic and atraumatic. Head is without raccoon's eyes and without Battle's sign.  Right Ear: No mastoid tenderness. No hemotympanum.  Left Ear: No mastoid tenderness. No hemotympanum.  Mouth/Throat: Oropharynx is clear and moist.  Eyes: Conjunctivae and EOM are normal. Pupils are equal, round, and reactive to light.  Neck: Normal range of motion. Neck supple.   Cardiovascular: Normal rate, regular rhythm and intact distal pulses.   Pulmonary/Chest: Effort normal and breath sounds normal. No respiratory distress. She has no wheezes. She has no rales. She exhibits no tenderness.  Abdominal: Soft. Bowel sounds are normal. There is no tenderness. There is no rebound and no guarding.  Musculoskeletal: Normal range of motion.       Right shoulder: Normal. She exhibits normal range of motion, no tenderness and no deformity.       Right knee: Normal. She exhibits normal range of motion, no swelling, no effusion, no ecchymosis, no deformity, no laceration, no erythema, normal alignment, no LCL laxity, normal patellar mobility, no bony tenderness, normal meniscus and no MCL laxity. No tenderness found. No medial joint line, no lateral joint line, no MCL, no LCL and no patellar tendon tenderness noted.  5/5 in all four extremites  Neurological: She is alert and oriented to person, place, and time.  Skin: Skin is warm and dry.  Psychiatric: She has a normal mood and affect.    ED Course  Procedures (including critical care time) Labs Review Labs Reviewed - No data to display  Imaging Review Dg Ribs Unilateral W/chest Right  07/03/2014   CLINICAL DATA:  Patient tripped and fell. Right lower anterior rib pain.  EXAM: RIGHT RIBS AND CHEST - 3+ VIEW  COMPARISON:  Chest 02/01/2014  FINDINGS: Normal heart size and pulmonary vascularity. No focal airspace disease or consolidation in the lungs. No blunting of costophrenic angles. No pneumothorax. Mediastinal contours appear intact. Degenerative changes in the spine and shoulders.  Right ribs appear intact. No displaced fractures or focal bone lesions appreciated.  IMPRESSION: No evidence of active pulmonary disease. No displaced right rib fractures.   Electronically Signed   By: Lucienne Capers M.D.   On: 07/03/2014 23:48   Dg Shoulder Right  07/03/2014   CLINICAL DATA:  Right shoulder pain after fall. Fall after  stepping in hole, pain in the lateral shoulder.  EXAM: RIGHT SHOULDER - 2+ VIEW  COMPARISON:  None.  FINDINGS: No fracture or dislocation. The alignment is maintained. Moderate proliferative change at the acromioclavicular joint. There is a subacromial spur. Subcortical irregularity of the greater tuberosity suggests underlying rotator cuff pathology.  IMPRESSION: Chronic change about the right shoulder without acute fracture or dislocation.   Electronically Signed   By: Jeb Levering M.D.   On: 07/03/2014 23:44   Dg Knee Complete 4 Views Right  07/03/2014   CLINICAL DATA:  Patient fell after stepping in a hole, landing on the right side.  Bruising and abrasions to the patellar surface of the knee.  EXAM: RIGHT KNEE - COMPLETE 4+ VIEW  COMPARISON:  None.  FINDINGS: Prominent tricompartment degenerative changes in the right knee with narrowed joint spaces and prominent osteophytes. No evidence of acute fracture or dislocation. No significant effusion. Soft tissues are unremarkable.  IMPRESSION: Prominent degenerative changes in the right knee. No acute bony abnormalities.   Electronically Signed   By: Lucienne Capers M.D.   On: 07/03/2014 23:47   Dg Hip Unilat With Pelvis 2-3 Views Right  07/03/2014   CLINICAL DATA:  Fall.  Pain on the lateral side of the hip.  EXAM: RIGHT HIP (WITH PELVIS) 2-3 VIEWS  COMPARISON:  CT 01/21/2012  FINDINGS: Stable sclerosis involving the sacroiliac joints bilaterally. Patient has a right total hip arthroplasty. The hip arthroplasty is located without fracture. Joint space narrowing and subchondral sclerosis involving the left hip joint. Multiple phleboliths throughout the pelvis. Pelvic bony ring is intact.  IMPRESSION: No acute bone abnormality to the pelvis or right hip.   Electronically Signed   By: Markus Daft M.D.   On: 07/03/2014 23:47     EKG Interpretation None      MDM   Final diagnoses:  Fall   Contusions: incentive spirometer and pain medication and close  follow up with your PMD   Cierrah Dace, MD 07/04/14 [phone]

## 2014-07-04 NOTE — Discharge Instructions (Signed)
Cryotherapy °Cryotherapy means treatment with cold. Ice or gel packs can be used to reduce both pain and swelling. Ice is the most helpful within the first 24 to 48 hours after an injury or flare-up from overusing a muscle or joint. Sprains, strains, spasms, burning pain, shooting pain, and aches can all be eased with ice. Ice can also be used when recovering from surgery. Ice is effective, has very few side effects, and is safe for most people to use. °PRECAUTIONS  °Ice is not a safe treatment option for people with: °· Raynaud phenomenon. This is a condition affecting small blood vessels in the extremities. Exposure to cold may cause your problems to return. °· Cold hypersensitivity. There are many forms of cold hypersensitivity, including: °· Cold urticaria. Red, itchy hives appear on the skin when the tissues begin to warm after being iced. °· Cold erythema. This is a red, itchy rash caused by exposure to cold. °· Cold hemoglobinuria. Red blood cells break down when the tissues begin to warm after being iced. The hemoglobin that carry oxygen are passed into the urine because they cannot combine with blood proteins fast enough. °· Numbness or altered sensitivity in the area being iced. °If you have any of the following conditions, do not use ice until you have discussed cryotherapy with your caregiver: °· Heart conditions, such as arrhythmia, angina, or chronic heart disease. °· High blood pressure. °· Healing wounds or open skin in the area being iced. °· Current infections. °· Rheumatoid arthritis. °· Poor circulation. °· Diabetes. °Ice slows the blood flow in the region it is applied. This is beneficial when trying to stop inflamed tissues from spreading irritating chemicals to surrounding tissues. However, if you expose your skin to cold temperatures for too long or without the proper protection, you can damage your skin or nerves. Watch for signs of skin damage due to cold. °HOME CARE INSTRUCTIONS °Follow  these tips to use ice and cold packs safely. °· Place a dry or damp towel between the ice and skin. A damp towel will cool the skin more quickly, so you may need to shorten the time that the ice is used. °· For a more rapid response, add gentle compression to the ice. °· Ice for no more than 10 to 20 minutes at a time. The bonier the area you are icing, the less time it will take to get the benefits of ice. °· Check your skin after 5 minutes to make sure there are no signs of a poor response to cold or skin damage. °· Rest 20 minutes or more between uses. °· Once your skin is numb, you can end your treatment. You can test numbness by very lightly touching your skin. The touch should be so light that you do not see the skin dimple from the pressure of your fingertip. When using ice, most people will feel these normal sensations in this order: cold, burning, aching, and numbness. °· Do not use ice on someone who cannot communicate their responses to pain, such as small children or people with dementia. °HOW TO MAKE AN ICE PACK °Ice packs are the most common way to use ice therapy. Other methods include ice massage, ice baths, and cryosprays. Muscle creams that cause a cold, tingly feeling do not offer the same benefits that ice offers and should not be used as a substitute unless recommended by your caregiver. °To make an ice pack, do one of the following: °· Place crushed ice or a   bag of frozen vegetables in a sealable plastic bag. Squeeze out the excess air. Place this bag inside another plastic bag. Slide the bag into a pillowcase or place a damp towel between your skin and the bag.  Mix 3 parts water with 1 part rubbing alcohol. Freeze the mixture in a sealable plastic bag. When you remove the mixture from the freezer, it will be slushy. Squeeze out the excess air. Place this bag inside another plastic bag. Slide the bag into a pillowcase or place a damp towel between your skin and the bag. SEEK MEDICAL CARE  IF:  You develop white spots on your skin. This may give the skin a blotchy (mottled) appearance.  Your skin turns blue or pale.  Your skin becomes waxy or hard.  Your swelling gets worse. MAKE SURE YOU:   Understand these instructions.  Will watch your condition.  Will get help right away if you are not doing well or get worse. Document Released: 12/07/2010 Document Revised: 08/27/2013 Document Reviewed: 12/07/2010 [facility] Patient Information 2015 [place], [place]. This information is not intended to replace advice given to you by your health care provider. Make sure you discuss any questions you have with your health care provider.  Fall Prevention and Home Safety Falls cause injuries and can affect all age groups. It is possible to use preventive measures to significantly decrease the likelihood of falls. There are many simple measures which can make your home safer and prevent falls. OUTDOORS  Repair cracks and edges of walkways and driveways.  Remove high doorway thresholds.  Trim shrubbery on the main path into your home.  Have good outside lighting.  Clear walkways of tools, rocks, debris, and clutter.  Check that handrails are not broken and are securely fastened. Both sides of steps should have handrails.  Have leaves, snow, and ice cleared regularly.  Use sand or salt on walkways during winter months.  In the garage, clean up grease or oil spills. BATHROOM  Install night lights.  Install grab bars by the toilet and in the tub and shower.  Use non-skid mats or decals in the tub or shower.  Place a plastic non-slip stool in the shower to sit on, if needed.  Keep floors dry and clean up all water on the floor immediately.  Remove soap buildup in the tub or shower on a regular basis.  Secure bath mats with non-slip, double-sided rug tape.  Remove throw rugs and tripping hazards from the floors. BEDROOMS  Install night lights.  Make sure a bedside  light is easy to reach.  Do not use oversized bedding.  Keep a telephone by your bedside.  Have a firm chair with side arms to use for getting dressed.  Remove throw rugs and tripping hazards from the floor. KITCHEN  Keep handles on pots and pans turned toward the center of the stove. Use back burners when possible.  Clean up spills quickly and allow time for drying.  Avoid walking on wet floors.  Avoid hot utensils and knives.  Position shelves so they are not too high or low.  Place commonly used objects within easy reach.  If necessary, use a sturdy step stool with a grab bar when reaching.  Keep electrical cables out of the way.  Do not use floor polish or wax that makes floors slippery. If you must use wax, use non-skid floor wax.  Remove throw rugs and tripping hazards from the floor. STAIRWAYS  Never leave objects on stairs.  Place handrails on both sides of stairways and use them. Fix any loose handrails. Make sure handrails on both sides of the stairways are as long as the stairs.  Check carpeting to make sure it is firmly attached along stairs. Make repairs to worn or loose carpet promptly.  Avoid placing throw rugs at the top or bottom of stairways, or properly secure the rug with carpet tape to prevent slippage. Get rid of throw rugs, if possible.  Have an electrician put in a light switch at the top and bottom of the stairs. OTHER FALL PREVENTION TIPS  Wear low-heel or rubber-soled shoes that are supportive and fit well. Wear closed toe shoes.  When using a stepladder, make sure it is fully opened and both spreaders are firmly locked. Do not climb a closed stepladder.  Add color or contrast paint or tape to grab bars and handrails in your home. Place contrasting color strips on first and last steps.  Learn and use mobility aids as needed. Install an electrical emergency response system.  Turn on lights to avoid dark areas. Replace light bulbs that burn  out immediately. Get light switches that glow.  Arrange furniture to create clear pathways. Keep furniture in the same place.  Firmly attach carpet with non-skid or double-sided tape.  Eliminate uneven floor surfaces.  Select a carpet pattern that does not visually hide the edge of steps.  Be aware of all pets. OTHER HOME SAFETY TIPS  Set the water temperature for 120 F (48.8 C).  Keep emergency numbers on or near the telephone.  Keep smoke detectors on every level of the home and near sleeping areas. Document Released: 04/02/2002 Document Revised: 10/12/2011 Document Reviewed: 07/02/2011 [facility] Patient Information 2015 [place], [place]. This information is not intended to replace advice given to you by your health care provider. Make sure you discuss any questions you have with your health care provider.

## 2014-07-09 ENCOUNTER — Encounter (HOSPITAL_COMMUNITY): Payer: Self-pay | Admitting: Emergency Medicine

## 2014-07-09 ENCOUNTER — Emergency Department (HOSPITAL_COMMUNITY)
Admission: EM | Admit: 2014-07-09 | Discharge: 2014-07-09 | Disposition: A | Discharge status: Home / Self Care | Payer: Medicare Other | Attending: Emergency Medicine | Admitting: Emergency Medicine

## 2014-07-09 DIAGNOSIS — I252 Old myocardial infarction: Secondary | ICD-10-CM | POA: Insufficient documentation

## 2014-07-09 DIAGNOSIS — Z8639 Personal history of other endocrine, nutritional and metabolic disease: Secondary | ICD-10-CM | POA: Insufficient documentation

## 2014-07-09 DIAGNOSIS — M79609 Pain in unspecified limb: Secondary | ICD-10-CM

## 2014-07-09 DIAGNOSIS — Z791 Long term (current) use of non-steroidal anti-inflammatories (NSAID): Secondary | ICD-10-CM | POA: Diagnosis not present

## 2014-07-09 DIAGNOSIS — J45909 Unspecified asthma, uncomplicated: Secondary | ICD-10-CM | POA: Diagnosis not present

## 2014-07-09 DIAGNOSIS — Z7982 Long term (current) use of aspirin: Secondary | ICD-10-CM | POA: Insufficient documentation

## 2014-07-09 DIAGNOSIS — I2510 Atherosclerotic heart disease of native coronary artery without angina pectoris: Secondary | ICD-10-CM | POA: Insufficient documentation

## 2014-07-09 DIAGNOSIS — Z880 Allergy status to penicillin: Secondary | ICD-10-CM | POA: Insufficient documentation

## 2014-07-09 DIAGNOSIS — M79662 Pain in left lower leg: Secondary | ICD-10-CM | POA: Insufficient documentation

## 2014-07-09 DIAGNOSIS — I10 Essential (primary) hypertension: Secondary | ICD-10-CM | POA: Diagnosis not present

## 2014-07-09 DIAGNOSIS — Z85038 Personal history of other malignant neoplasm of large intestine: Secondary | ICD-10-CM | POA: Insufficient documentation

## 2014-07-09 DIAGNOSIS — M79605 Pain in left leg: Secondary | ICD-10-CM

## 2014-07-09 NOTE — ED Notes (Signed)
Vascular called and will be here when you fisishes other patients.Angela Patton

## 2014-07-09 NOTE — Progress Notes (Signed)
CSW met with pt at bedside. Friend was present. Pt states that she presents to [facility] due to left leg pain. Pt states that pain is caused due to her falling last week.  Pt informed CSW that she lives at home with her husband. She states that she is able to complete her ADL's independently. Also, pt informed CSW that she is not interested in a facility. Pt appears to be self determined that she will be able to properly care for herself at home. Patient says that her husband is her primary support.  Pt states that she does not have any questions at this time.  Friend/Gloria Palmer [phone]  Willette Brace [phone] ED CSW 07/09/2014 9:34 PM

## 2014-07-09 NOTE — Discharge Instructions (Signed)

## 2014-07-09 NOTE — Progress Notes (Signed)
  CARE MANAGEMENT ED NOTE 07/09/2014  Patient:  Angela Patton, Angela Patton   Account Number:  [id number]  Date Initiated:  07/09/2014  Documentation initiated by:  Livia Snellen  Subjective/Objective Assessment:   Patient presents to Ed with left calf pain and swelling to bilateral lower extrmities     Subjective/Objective Assessment Detail:   Patient reports she did not fall at home.  she fell at her sister's funeral on Sat.  Has not had any falls at home. Patient walks with a cane     Action/Plan:   Action/Plan Detail:   Anticipated DC Date:  07/09/2014     Status Recommendation to Physician:   Result of Recommendation:    Other ED Services  Consult Working [place]  Other    Choice offered to / List presented to:            Status of service:  Completed, signed off  ED Comments:   ED Comments Detail:  EDCM spoke to patient at bedside.  patient lives at home with her husband and her son.  EDCM assessed for homehealth needs at this time.  Patient politely declined home health agency resources.  Patient also declined need for dme at this time.  Patient is without home health needs at this time.  No further EDCm needs at this time.

## 2014-07-09 NOTE — ED Notes (Signed)
Pt reports L calf pain that began yesterday. No recently long periods of immobilization. Pt seen week ago for fall and was not having any pain at that time. L leg slightly more swollen than other side.

## 2014-07-09 NOTE — ED Notes (Signed)
US at bedside

## 2014-07-09 NOTE — Progress Notes (Signed)
VASCULAR LAB PRELIMINARY  PRELIMINARY  PRELIMINARY  PRELIMINARY  Left lower extremity venous duplex completed.    Preliminary report:  Left:  No evidence of DVT, superficial thrombosis, or Baker's cyst.  Harpreet Signore, RVS 07/09/2014, 9:10 PM

## 2014-07-09 NOTE — ED Provider Notes (Signed)
CSN: [id number]     Arrival date & time 07/09/14  1657 History   First MD Initiated Contact with Patient 07/09/14 1839     Chief Complaint  Patient presents with  . Leg Pain     (Consider location/radiation/quality/duration/timing/severity/associated sxs/prior Treatment) HPI Comments: Patient presents emergency department with chief complaint of left lower extremity pain and swelling. She states the last week she traveled by car about 2-1/2 hours in each direction to a funeral. She states that while she is at the funeral she had a mechanical fall. She was seen for the fall, and had a negative workup. She was treated for contusions. She states that she has had worsening pain in the left lower extremity and has had some swelling in her calf with posterior calf tenderness. She also reports some tingling sensation. She is concerned about a blood clot. She states that when she fell originally, she also bruised her ribs. She states that she can still feel pain on her right inferior ribs, but that this is improving.  The history is provided by the patient. No language interpreter was used.    Past Medical History  Diagnosis Date  . Hypertension   . Coronary artery disease     MI per patient 2007 elsewhere, no records, pt. says cath  was OK  . Asthma   . Cancer     colon   . Hyperlipidemia   . Chest pain     Hospital 09/2010,  /   Stress echo October 29, 2010, vigorous LV function with stress.  All walls could not be assessed fully but it was felt that there was no ischemia.  . Elevated CPK     Hospital 09/2010,  troponin normal  . MI (myocardial infarction)     2007   Past Surgical History  Procedure Laterality Date  . Colon surgery      2004  . Cesarean section    . Total hip arthroplasty      right hip 2007  . Abdominal hysterectomy    . Hernia repair      three total, 2007  . Shoulder surgery      left shoulder 2008  . Foot surgery      left 1999   Family History  Problem  Relation Age of Onset  . Cancer Mother     Bone  . Cancer Father     Colon  . Diabetes Sister   . Cancer Brother     Hodgkins  . Cancer Brother     Stomach  . Diabetes Sister   . Diabetes Sister    History  Substance Use Topics  . Smoking status: Never Smoker   . Smokeless tobacco: Not on file  . Alcohol Use: Yes     Comment: Rare   OB History    Gravida Para Term Preterm AB TAB SAB Ectopic Multiple Living   3 3        3      Review of Systems  Constitutional: Negative for fever and chills.  Respiratory: Negative for shortness of breath.   Cardiovascular: Negative for chest pain.  Gastrointestinal: Negative for nausea, vomiting, diarrhea and constipation.  Genitourinary: Negative for dysuria.  Musculoskeletal: Positive for myalgias.  All other systems reviewed and are negative.     Allergies  Penicillins  Home Medications   Prior to Admission medications   Medication Sig Start Date End Date Taking? Authorizing Provider  aspirin 81 MG tablet Take 81 mg by  mouth daily.    Historical Provider, MD  HYDROcodone-acetaminophen (NORCO/VICODIN) 5-325 MG per tablet Take 1-2 tablets by mouth every 6 (six) hours as needed. Patient taking differently: Take 1-2 tablets by mouth every 6 (six) hours as needed for moderate pain.  10/07/13   Glendell Docker, NP  metroNIDAZOLE (FLAGYL) 500 MG tablet Take 1 tablet (500 mg total) by mouth 2 (two) times daily. Patient not taking: Reported on 04/22/2014 02/01/14   Anastasio Auerbach, MD  metroNIDAZOLE (FLAGYL) 500 MG tablet Take 1 tablet (500 mg total) by mouth 2 (two) times daily. For 7 days.  Avoid alcohol while taking Patient not taking: Reported on 07/04/2014 04/22/14   Anastasio Auerbach, MD  naproxen (NAPROSYN) 375 MG tablet Take 1 tablet (375 mg total) by mouth 2 (two) times daily. 07/04/14   April Palumbo, MD  traMADol (ULTRAM) 50 MG tablet Take 1 tablet (50 mg total) by mouth every 6 (six) hours as needed. 07/04/14   April Palumbo,  MD   BP 152/71 mmHg  Pulse 82  Temp(Src) 98.3 F (36.8 C) (Oral)  Resp 16  SpO2 95% Physical Exam  Constitutional: She is oriented to person, place, and time. She appears well-developed and well-nourished.  HENT:  Head: Normocephalic and atraumatic.  Eyes: Conjunctivae and EOM are normal. Pupils are equal, round, and reactive to light.  Neck: Normal range of motion. Neck supple.  Cardiovascular: Normal rate and regular rhythm.  Exam reveals no gallop and no friction rub.   No murmur heard. Pulmonary/Chest: Effort normal and breath sounds normal. No respiratory distress. She has no wheezes. She has no rales. She exhibits no tenderness.  Abdominal: Soft. Bowel sounds are normal. She exhibits no distension and no mass. There is no tenderness. There is no rebound and no guarding.  Musculoskeletal: Normal range of motion. She exhibits no edema or tenderness.  Left calf tenderness palpation, no bony abnormality or deformity, range of motion strength 5/5  Neurological: She is alert and oriented to person, place, and time.  Sensation intact  Skin: Skin is warm and dry.  Psychiatric: She has a normal mood and affect. Her behavior is normal. Judgment and thought content normal.  Nursing note and vitals reviewed.   ED Course  Procedures (including critical care time) Labs Review Labs Reviewed - No data to display  Imaging Review No results found.   EKG Interpretation None      MDM   Final diagnoses:  Pain of left lower extremity    Patient with mechanical fall about a week ago as well as some increased travel. Concern for DVT of left lower extremity. Will check Doppler ultrasound, and will reassess.  8:24 PM Vascular has arrived.  9:13 PM VASCULAR LAB PRELIMINARY PRELIMINARY PRELIMINARY PRELIMINARY  Left lower extremity venous duplex completed.   Preliminary report: Left: No evidence of DVT, superficial thrombosis, or Baker's cyst.   DVT study is negative. Will  discharge patient to home with primary care follow-up. She understands and agrees plan. She is stable and ready for discharge.  Montine Circle, PA-C 07/09/14 2114  Dorie Rank, MD 07/09/14 2126

## 2014-09-10 ENCOUNTER — Encounter (HOSPITAL_COMMUNITY): Payer: Self-pay | Admitting: Emergency Medicine

## 2014-09-10 ENCOUNTER — Emergency Department (HOSPITAL_COMMUNITY)
Admission: EM | Admit: 2014-09-10 | Discharge: 2014-09-10 | Disposition: A | Discharge status: Home / Self Care | Payer: Medicare Other | Attending: Emergency Medicine | Admitting: Emergency Medicine

## 2014-09-10 ENCOUNTER — Emergency Department (HOSPITAL_COMMUNITY): Payer: Medicare Other

## 2014-09-10 DIAGNOSIS — Z791 Long term (current) use of non-steroidal anti-inflammatories (NSAID): Secondary | ICD-10-CM | POA: Insufficient documentation

## 2014-09-10 DIAGNOSIS — Z7982 Long term (current) use of aspirin: Secondary | ICD-10-CM | POA: Diagnosis not present

## 2014-09-10 DIAGNOSIS — J45901 Unspecified asthma with (acute) exacerbation: Secondary | ICD-10-CM | POA: Insufficient documentation

## 2014-09-10 DIAGNOSIS — Z792 Long term (current) use of antibiotics: Secondary | ICD-10-CM | POA: Insufficient documentation

## 2014-09-10 DIAGNOSIS — I252 Old myocardial infarction: Secondary | ICD-10-CM | POA: Insufficient documentation

## 2014-09-10 DIAGNOSIS — I10 Essential (primary) hypertension: Secondary | ICD-10-CM | POA: Diagnosis not present

## 2014-09-10 DIAGNOSIS — Z880 Allergy status to penicillin: Secondary | ICD-10-CM | POA: Insufficient documentation

## 2014-09-10 DIAGNOSIS — I2510 Atherosclerotic heart disease of native coronary artery without angina pectoris: Secondary | ICD-10-CM | POA: Insufficient documentation

## 2014-09-10 DIAGNOSIS — J40 Bronchitis, not specified as acute or chronic: Secondary | ICD-10-CM

## 2014-09-10 DIAGNOSIS — Z85038 Personal history of other malignant neoplasm of large intestine: Secondary | ICD-10-CM | POA: Insufficient documentation

## 2014-09-10 DIAGNOSIS — J029 Acute pharyngitis, unspecified: Secondary | ICD-10-CM | POA: Diagnosis present

## 2014-09-10 DIAGNOSIS — Z8639 Personal history of other endocrine, nutritional and metabolic disease: Secondary | ICD-10-CM | POA: Diagnosis not present

## 2014-09-10 LAB — CBC WITH DIFFERENTIAL/PLATELET
BASOS PCT: 0 % (ref 0–1)
Basophils Absolute: 0 10*3/uL (ref 0.0–0.1)
Eosinophils Absolute: 0.3 10*3/uL (ref 0.0–0.7)
Eosinophils Relative: 4 % (ref 0–5)
HCT: 37.8 % (ref 36.0–46.0)
Hemoglobin: 11.7 g/dL — ABNORMAL LOW (ref 12.0–15.0)
Lymphocytes Relative: 39 % (ref 12–46)
Lymphs Abs: 2.9 10*3/uL (ref 0.7–4.0)
MCH: 28.3 pg (ref 26.0–34.0)
MCHC: 31 g/dL (ref 30.0–36.0)
MCV: 91.5 fL (ref 78.0–100.0)
MONOS PCT: 6 % (ref 3–12)
Monocytes Absolute: 0.5 10*3/uL (ref 0.1–1.0)
NEUTROS ABS: 3.8 10*3/uL (ref 1.7–7.7)
Neutrophils Relative %: 51 % (ref 43–77)
Platelets: 226 10*3/uL (ref 150–400)
RBC: 4.13 MIL/uL (ref 3.87–5.11)
RDW: 13.4 % (ref 11.5–15.5)
WBC: 7.4 10*3/uL (ref 4.0–10.5)

## 2014-09-10 LAB — BASIC METABOLIC PANEL
Anion gap: 10 (ref 5–15)
BUN: 13 mg/dL (ref 6–20)
CO2: 24 mmol/L (ref 22–32)
CREATININE: 0.9 mg/dL (ref 0.44–1.00)
Calcium: 10 mg/dL (ref 8.9–10.3)
Chloride: 104 mmol/L (ref 101–111)
Glucose, Bld: 95 mg/dL (ref 65–99)
Potassium: 3.9 mmol/L (ref 3.5–5.1)
SODIUM: 138 mmol/L (ref 135–145)

## 2014-09-10 LAB — TROPONIN I

## 2014-09-10 MED ORDER — SODIUM CHLORIDE 0.9 % IV BOLUS (SEPSIS)
1000.0000 mL | Freq: Once | INTRAVENOUS | Status: AC
  Administered 2014-09-10: 1000 mL via INTRAVENOUS

## 2014-09-10 MED ORDER — ALBUTEROL SULFATE (2.5 MG/3ML) 0.083% IN NEBU
5.0000 mg | INHALATION_SOLUTION | Freq: Once | RESPIRATORY_TRACT | Status: AC
Start: 2014-09-10 — End: 2014-09-10
  Administered 2014-09-10: 5 mg via RESPIRATORY_TRACT
  Filled 2014-09-10: qty 6

## 2014-09-10 MED ORDER — ALBUTEROL SULFATE HFA 108 (90 BASE) MCG/ACT IN AERS
INHALATION_SPRAY | RESPIRATORY_TRACT | Status: AC
  Administered 2014-09-10: 18:00:00
  Filled 2014-09-10: qty 6.7

## 2014-09-10 MED ORDER — PREDNISONE 20 MG PO TABS: ORAL_TABLET | ORAL | Status: DC

## 2014-09-10 MED ORDER — ALBUTEROL SULFATE HFA 108 (90 BASE) MCG/ACT IN AERS: 1.0000 | INHALATION_SPRAY | Freq: Four times a day (QID) | RESPIRATORY_TRACT | Status: AC | PRN

## 2014-09-10 MED ORDER — METHYLPREDNISOLONE SODIUM SUCC 125 MG IJ SOLR
125.0000 mg | Freq: Once | INTRAMUSCULAR | Status: AC
  Administered 2014-09-10: 125 mg via INTRAVENOUS
  Filled 2014-09-10: qty 2

## 2014-09-10 MED ORDER — HYDROCOD POLST-CPM POLST ER 10-8 MG/5ML PO SUER: 5.0000 mL | Freq: Two times a day (BID) | ORAL | Status: DC | PRN

## 2014-09-10 NOTE — ED Notes (Signed)
Spoke with M.D. No monitor needed

## 2014-09-10 NOTE — Discharge Instructions (Signed)
Take prednisone as prescribed.  Use albuterol every 6 hrs as needed for shortness of breath.   Follow up with your doctor.   Return to ER if you have worse cough, shortness of breath.

## 2014-09-10 NOTE — ED Notes (Signed)
Pt returned to room  

## 2014-09-10 NOTE — ED Notes (Signed)
Pt currently in radiology. Will draw labs when pt returns.

## 2014-09-10 NOTE — ED Provider Notes (Signed)
CSN: [id number]     Arrival date & time 09/10/14  1452 History   First MD Initiated Contact with Patient 09/10/14 1524     Chief Complaint  Patient presents with  . Shortness of Breath  . Cough  . Sore Throat     (Consider location/radiation/quality/duration/timing/severity/associated sxs/prior Treatment) The history is provided by the patient.  Angela Patton is a 64 y.o. female hx of HTN, CAD, here presenting with shortness of breath, sore throat. Patient has sore throat for the last 10 days. She went to urgent care a week ago and was started on Keflex but she doesn't remember if her strep was positive or not. Had persistent cough and sore throat since then so went back to urgent care 3 days ago and was prescribed some cough medicine. However, she has been persistently coughing with some blood tinge sputum. Also some shortness of breath as well. Denies any chest pain. Denies any history of blood clots. Denies any recent travel or history of TB.    Past Medical History  Diagnosis Date  . Hypertension   . Coronary artery disease     MI per patient 2007 elsewhere, no records, pt. says cath  was OK  . Asthma   . Cancer     colon   . Hyperlipidemia   . Chest pain     Hospital 09/2010,  /   Stress echo October 29, 2010, vigorous LV function with stress.  All walls could not be assessed fully but it was felt that there was no ischemia.  . Elevated CPK     Hospital 09/2010,  troponin normal  . MI (myocardial infarction)     2007   Past Surgical History  Procedure Laterality Date  . Colon surgery      2004  . Cesarean section    . Total hip arthroplasty      right hip 2007  . Abdominal hysterectomy    . Hernia repair      three total, 2007  . Shoulder surgery      left shoulder 2008  . Foot surgery      left 1999   Family History  Problem Relation Age of Onset  . Cancer Mother     Bone  . Cancer Father     Colon  . Diabetes Sister   . Cancer Brother     Hodgkins  . Cancer  Brother     Stomach  . Diabetes Sister   . Diabetes Sister    History  Substance Use Topics  . Smoking status: Never Smoker   . Smokeless tobacco: Not on file  . Alcohol Use: Yes     Comment: Rare   OB History    Gravida Para Term Preterm AB TAB SAB Ectopic Multiple Living   3 3        3      Review of Systems  Respiratory: Positive for cough and shortness of breath.   All other systems reviewed and are negative.     Allergies  Penicillins  Home Medications   Prior to Admission medications   Medication Sig Start Date End Date Taking? Authorizing Provider  aspirin 81 MG tablet Take 81 mg by mouth daily.   Yes Historical Provider, MD  cephALEXin (KEFLEX) 250 MG capsule Take 1 capsule by mouth 4 (four) times daily - after meals and at bedtime. 09/03/14  Yes Historical Provider, MD  HYDROcodone-acetaminophen (NORCO/VICODIN) 5-325 MG per tablet Take 1-2 tablets by mouth every  6 (six) hours as needed. Patient not taking: Reported on 09/10/2014 10/07/13   Glendell Docker, NP  metroNIDAZOLE (FLAGYL) 500 MG tablet Take 1 tablet (500 mg total) by mouth 2 (two) times daily. Patient not taking: Reported on 04/22/2014 02/01/14   Anastasio Auerbach, MD  metroNIDAZOLE (FLAGYL) 500 MG tablet Take 1 tablet (500 mg total) by mouth 2 (two) times daily. For 7 days.  Avoid alcohol while taking Patient not taking: Reported on 07/04/2014 04/22/14   Anastasio Auerbach, MD  naproxen (NAPROSYN) 375 MG tablet Take 1 tablet (375 mg total) by mouth 2 (two) times daily. Patient not taking: Reported on 09/10/2014 07/04/14   April Palumbo, MD  traMADol (ULTRAM) 50 MG tablet Take 1 tablet (50 mg total) by mouth every 6 (six) hours as needed. Patient not taking: Reported on 09/10/2014 07/04/14   April Palumbo, MD   BP 159/62 mmHg  Pulse 77  Temp(Src) 98.2 F (36.8 C) (Oral)  Resp 20  SpO2 100% Physical Exam  Constitutional: She is oriented to person, place, and time. She appears well-developed and  well-nourished.  HENT:  Head: Normocephalic.  OP clear   Eyes: Conjunctivae are normal. Pupils are equal, round, and reactive to light.  Neck: Normal range of motion.  Some mild cervical LAD   Cardiovascular: Normal rate, regular rhythm and normal heart sounds.   Pulmonary/Chest:  Slightly tachypneic, + mild diffuse wheezing. No retractions.   Abdominal: Soft. Bowel sounds are normal. She exhibits no distension. There is no tenderness. There is no rebound.  Musculoskeletal: Normal range of motion. She exhibits no edema or tenderness.  Neurological: She is alert and oriented to person, place, and time.  Skin: Skin is warm and dry.  Psychiatric: She has a normal mood and affect. Her behavior is normal. Judgment and thought content normal.  Nursing note and vitals reviewed.   ED Course  Procedures (including critical care time) Labs Review Labs Reviewed  CBC WITH DIFFERENTIAL/PLATELET - Abnormal; Notable for the following:    Hemoglobin 11.7 (*)    All other components within normal limits  BASIC METABOLIC PANEL  TROPONIN I    Imaging Review Dg Neck Soft Tissue  09/10/2014   CLINICAL DATA:  Shortness of breath, cough and sore throat.  EXAM: NECK SOFT TISSUES - 1+ VIEW  COMPARISON:  None.  FINDINGS: No soft tissue swelling identified. The visualized airway is normally patent. Epiglottis and aryepiglottic folds appear normal by x-ray. No foreign body identified. Degenerative changes are seen throughout the cervical spine.  IMPRESSION: Normal neck soft tissue radiographs.   Electronically Signed   By: Aletta Edouard M.D.   On: 09/10/2014 16:17   Dg Chest 2 View  09/10/2014   CLINICAL DATA:  Shortness of breath and cough.  EXAM: CHEST  2 VIEW  COMPARISON:  07/03/2014 and 02/01/2014  FINDINGS: The heart size and mediastinal contours are within normal limits. Both lungs are clear. No acute osseous abnormality. Degenerative osteophytes throughout the thoracic spine, unchanged. Degenerative  changes of both shoulders.  IMPRESSION: No acute abnormalities.   Electronically Signed   By: Lorriane Shire M.D.   On: 09/10/2014 15:47     EKG Interpretation   Date/Time:  Tuesday Sep 10 2014 16:47:10 EDT Ventricular Rate:  84 PR Interval:  180 QRS Duration: 100 QT Interval:  386 QTC Calculation: 456 R Axis:   -38 Text Interpretation:  Sinus rhythm Left axis deviation Low voltage,  precordial leads Borderline T abnormalities, anterior leads No significant  change since last tracing Confirmed by YAO  MD, DAVID ([id number]) on 09/10/2014  5:41:03 PM      MDM   Final diagnoses:  None   Angela Patton is a 64 y.o. female here with shortness of breath, cough. Likely bronchitis. Some cervical LAD but no OP erythema and is already on keflex. Will get neck xray but low suspicion for RPA. Will get labs, give steroids, CXR.   5:41 PM Labs and xray unremarkable. Felt better with steroids. Will dc home with prednisone, albuterol for bronchitis.     Wandra Arthurs, MD 09/10/14 [phone]

## 2014-09-10 NOTE — ED Notes (Signed)
Pt in restroom 

## 2015-01-02 ENCOUNTER — Encounter: Payer: Medicare Other | Admitting: Gynecology

## 2015-03-05 ENCOUNTER — Encounter: Payer: Medicare Other | Admitting: Gynecology

## 2015-05-09 ENCOUNTER — Encounter: Payer: Medicare Other | Admitting: Gynecology

## 2015-06-20 ENCOUNTER — Encounter: Payer: Medicare Other | Admitting: Gynecology

## 2016-02-25 ENCOUNTER — Ambulatory Visit (INDEPENDENT_AMBULATORY_CARE_PROVIDER_SITE_OTHER): Payer: Medicare Other | Admitting: Gynecology

## 2016-02-25 ENCOUNTER — Encounter: Payer: Self-pay | Admitting: Gynecology

## 2016-02-25 VITALS — BP 124/78 | Ht 64.0 in | Wt 216.0 lb

## 2016-02-25 DIAGNOSIS — Z01419 Encounter for gynecological examination (general) (routine) without abnormal findings: Secondary | ICD-10-CM

## 2016-02-25 DIAGNOSIS — N898 Other specified noninflammatory disorders of vagina: Secondary | ICD-10-CM | POA: Diagnosis not present

## 2016-02-25 DIAGNOSIS — N952 Postmenopausal atrophic vaginitis: Secondary | ICD-10-CM

## 2016-02-25 LAB — WET PREP FOR TRICH, YEAST, CLUE
Clue Cells Wet Prep HPF POC: NONE SEEN
Trich, Wet Prep: NONE SEEN

## 2016-02-25 MED ORDER — FLUCONAZOLE 150 MG PO TABS: 150.0000 mg | ORAL_TABLET | Freq: Once | ORAL | 3 refills | Status: AC

## 2016-02-25 NOTE — Patient Instructions (Addendum)
Schedule our mammogram  Schedule your bone density  Take the one Diflucan pill for the yeast infection. Follow up if the symptoms persist or recur.

## 2016-02-25 NOTE — Addendum Note (Signed)
Addended by: Nelva Nay on: 02/25/2016 05:00 PM   Modules accepted: Orders

## 2016-02-25 NOTE — Progress Notes (Signed)
    Angela Patton 1950/09/19 [id number]        65 y.o.  G3P3  for annual exam.  Several issues noted below.  Past medical history,surgical history, problem list, medications, allergies, family history and social history were all reviewed and documented as reviewed in the EPIC chart.  ROS:  Performed with pertinent positives and negatives included in the history, assessment and plan.   Additional significant findings :  None   Exam: [name] assistant Vitals:   02/25/16 1533  BP: 124/78  Weight: 216 lb (98 kg)  Height: 5\' 4"  (1.626 m)   Body mass index is 37.08 kg/m.  General appearance:  Normal affect, orientation and appearance. Skin: Grossly normal HEENT: Without gross lesions.  No cervical or supraclavicular adenopathy. Thyroid normal.  Lungs:  Clear without wheezing, rales or rhonchi Cardiac: RR, without RMG Abdominal:  Soft, nontender, without masses, guarding, rebound, organomegaly or hernia Breasts:  Examined lying and sitting without masses, retractions, discharge or axillary adenopathy. Pelvic:  Ext, BUS, Vagina with white discharge  Adnexa without masses or tenderness    Anus and perineum normal   Rectovaginal normal sphincter tone without palpated masses or tenderness.    Assessment/Plan:  65 y.o. G3P3 female for annual exam. Status post TAH 2005 at the time of her colon surgery. Believes both ovaries were also removed.  1. Vaginal odor.  History of vaginal odor intermittently. No discharge irritation or urinary symptoms such as frequency dysuria or urgency. Has been treated for bacterial vaginosis in the past. Wet prep today actually shows yeast. Will cover with Diflucan 150 mg 1 dose. I did give her several refills to have available if recurrent. Assuming this resolves and will follow. If her symptoms persist and she'll follow up for further evaluation. 2. Postmenopausal. Without significant hot flushes, night sweats or vaginal dryness. Continue to monitor and  report any issues. 3. Mammography overdue. Recommended patient schedule and she agrees to do so. SBE monthly reviewed. 4. Pap smear 2015. No Pap smear done today. No history of significant abnormal Pap smears. Options to stop screening altogether per current screening guidelines based on age and hysterectomy history reviewed. Will readdress on annual basis. 5. DEXA 2012 normal. Recommend baseline DEXA now at 5 year interval and she agrees to schedule. 6. Colonoscopy 2015. Due for colonoscopy now with history of colon cancer she reports they recommend every other year and she is going to call to schedule. 7. Right hand pain. Fell at her sister's funeral and since then has had discomfort in numbness tingling in her right hand. Had x-rays and was told nothing was broken. Exam overall is normal with good pulses, capillary refill and no overt evidence of deformity or acute tenderness to palpation. Will refer to the hand Center for further evaluation. 8. Health maintenance. No routine lab work done as patient reports this done elsewhere. Follow up for bone density otherwise annual exam in one year.  Greater than 10 minutes of my time in excess of her gynecologic exam was spent in direct face to face counseling and coordination of care in regards to her vaginal odor with treatment provided as well as evaluation of her right hand pain.    Anastasio Auerbach MD, 3:58 PM 02/25/2016

## 2016-02-26 ENCOUNTER — Other Ambulatory Visit: Payer: Self-pay | Admitting: Gynecology

## 2016-02-26 ENCOUNTER — Telehealth: Payer: Self-pay | Admitting: *Deleted

## 2016-02-26 DIAGNOSIS — Z1231 Encounter for screening mammogram for malignant neoplasm of breast: Secondary | ICD-10-CM

## 2016-02-26 NOTE — Telephone Encounter (Signed)
-----   Message from Anastasio Auerbach, MD sent at 02/25/2016  4:09 PM EDT ----- Schedule an appointment at the hand center of [facility] reference right hand pain following a fall with numbness and discomfort. X-ray studies reportedly negative

## 2016-02-26 NOTE — Telephone Encounter (Signed)
Pt scheduled on 03/03/16 @ 9:45am with Dr.Kuzma, notes faxed at hand center of [facility]. Left message for pt to call.

## 2016-02-26 NOTE — Telephone Encounter (Signed)
Pt informed with the below note. 

## 2016-03-30 ENCOUNTER — Ambulatory Visit: Payer: PRIVATE HEALTH INSURANCE

## 2016-03-30 ENCOUNTER — Ambulatory Visit
Admission: RE | Admit: 2016-03-30 | Discharge: 2016-03-30 | Disposition: A | Discharge status: Home / Self Care | Payer: Medicare Other | Source: Ambulatory Visit | Attending: Gynecology | Admitting: Gynecology

## 2016-03-30 DIAGNOSIS — Z1231 Encounter for screening mammogram for malignant neoplasm of breast: Secondary | ICD-10-CM

## 2016-04-28 ENCOUNTER — Emergency Department (HOSPITAL_COMMUNITY): Payer: Medicare Other

## 2016-04-28 ENCOUNTER — Encounter (HOSPITAL_COMMUNITY): Payer: Self-pay | Admitting: Emergency Medicine

## 2016-04-28 ENCOUNTER — Emergency Department (HOSPITAL_COMMUNITY)
Admission: EM | Admit: 2016-04-28 | Discharge: 2016-04-28 | Disposition: A | Discharge status: Home / Self Care | Payer: Medicare Other | Attending: Emergency Medicine | Admitting: Emergency Medicine

## 2016-04-28 DIAGNOSIS — J45901 Unspecified asthma with (acute) exacerbation: Secondary | ICD-10-CM | POA: Diagnosis not present

## 2016-04-28 DIAGNOSIS — Z96641 Presence of right artificial hip joint: Secondary | ICD-10-CM | POA: Insufficient documentation

## 2016-04-28 DIAGNOSIS — I10 Essential (primary) hypertension: Secondary | ICD-10-CM | POA: Diagnosis not present

## 2016-04-28 DIAGNOSIS — I252 Old myocardial infarction: Secondary | ICD-10-CM | POA: Insufficient documentation

## 2016-04-28 DIAGNOSIS — R0602 Shortness of breath: Secondary | ICD-10-CM | POA: Diagnosis present

## 2016-04-28 DIAGNOSIS — Z85038 Personal history of other malignant neoplasm of large intestine: Secondary | ICD-10-CM | POA: Diagnosis not present

## 2016-04-28 DIAGNOSIS — I2510 Atherosclerotic heart disease of native coronary artery without angina pectoris: Secondary | ICD-10-CM | POA: Insufficient documentation

## 2016-04-28 LAB — CBC WITH DIFFERENTIAL/PLATELET
BASOS ABS: 0 10*3/uL (ref 0.0–0.1)
Basophils Relative: 0 %
EOS PCT: 3 %
Eosinophils Absolute: 0.1 10*3/uL (ref 0.0–0.7)
HEMATOCRIT: 37.8 % (ref 36.0–46.0)
Hemoglobin: 12.1 g/dL (ref 12.0–15.0)
LYMPHS PCT: 38 %
Lymphs Abs: 1.8 10*3/uL (ref 0.7–4.0)
MCH: 28.6 pg (ref 26.0–34.0)
MCHC: 32 g/dL (ref 30.0–36.0)
MCV: 89.4 fL (ref 78.0–100.0)
MONOS PCT: 8 %
Monocytes Absolute: 0.4 10*3/uL (ref 0.1–1.0)
NEUTROS PCT: 51 %
Neutro Abs: 2.4 10*3/uL (ref 1.7–7.7)
PLATELETS: 182 10*3/uL (ref 150–400)
RBC: 4.23 MIL/uL (ref 3.87–5.11)
RDW: 13.5 % (ref 11.5–15.5)
WBC: 4.7 10*3/uL (ref 4.0–10.5)

## 2016-04-28 LAB — COMPREHENSIVE METABOLIC PANEL
ALBUMIN: 4.4 g/dL (ref 3.5–5.0)
ALT: 29 U/L (ref 14–54)
AST: 48 U/L — AB (ref 15–41)
Alkaline Phosphatase: 83 U/L (ref 38–126)
Anion gap: 10 (ref 5–15)
BILIRUBIN TOTAL: 0.8 mg/dL (ref 0.3–1.2)
BUN: 15 mg/dL (ref 6–20)
CHLORIDE: 101 mmol/L (ref 101–111)
CO2: 23 mmol/L (ref 22–32)
CREATININE: 0.97 mg/dL (ref 0.44–1.00)
Calcium: 9.4 mg/dL (ref 8.9–10.3)
GFR calc Af Amer: >60 mL/min (ref >60)
GFR, EST NON AFRICAN AMERICAN: 60 mL/min — AB (ref >60)
GLUCOSE: 106 mg/dL — AB (ref 65–99)
POTASSIUM: 4.1 mmol/L (ref 3.5–5.1)
Sodium: 134 mmol/L — ABNORMAL LOW (ref 135–145)
Total Protein: 8 g/dL (ref 6.5–8.1)

## 2016-04-28 LAB — I-STAT TROPONIN, ED: Troponin i, poc: 0 ng/mL (ref 0.00–0.08)

## 2016-04-28 LAB — BRAIN NATRIURETIC PEPTIDE: B NATRIURETIC PEPTIDE 5: 15.6 pg/mL (ref 0.0–100.0)

## 2016-04-28 MED ORDER — PREDNISONE 20 MG PO TABS
60.0000 mg | ORAL_TABLET | Freq: Once | ORAL | Status: AC
  Administered 2016-04-28: 60 mg via ORAL
  Filled 2016-04-28: qty 3

## 2016-04-28 MED ORDER — DIPHENHYDRAMINE HCL 50 MG/ML IJ SOLN
25.0000 mg | Freq: Once | INTRAMUSCULAR | Status: AC
  Administered 2016-04-28: 25 mg via INTRAVENOUS
  Filled 2016-04-28: qty 1

## 2016-04-28 MED ORDER — ACETAMINOPHEN 500 MG PO TABS
1000.0000 mg | ORAL_TABLET | Freq: Once | ORAL | Status: AC
  Administered 2016-04-28: 1000 mg via ORAL
  Filled 2016-04-28 (×2): qty 2

## 2016-04-28 MED ORDER — IPRATROPIUM-ALBUTEROL 0.5-2.5 (3) MG/3ML IN SOLN
3.0000 mL | Freq: Once | RESPIRATORY_TRACT | Status: AC
  Administered 2016-04-28: 3 mL via RESPIRATORY_TRACT
  Filled 2016-04-28: qty 3

## 2016-04-28 MED ORDER — ALBUTEROL SULFATE HFA 108 (90 BASE) MCG/ACT IN AERS
4.0000 | INHALATION_SPRAY | Freq: Once | RESPIRATORY_TRACT | Status: AC
  Administered 2016-04-28: 4 via RESPIRATORY_TRACT
  Filled 2016-04-28: qty 6.7

## 2016-04-28 MED ORDER — ALBUTEROL SULFATE (2.5 MG/3ML) 0.083% IN NEBU
5.0000 mg | INHALATION_SOLUTION | Freq: Once | RESPIRATORY_TRACT | Status: AC
  Administered 2016-04-28: 5 mg via RESPIRATORY_TRACT
  Filled 2016-04-28: qty 6

## 2016-04-28 MED ORDER — PREDNISONE 10 MG PO TABS: 50.0000 mg | ORAL_TABLET | Freq: Every day | ORAL | 0 refills | Status: AC

## 2016-04-28 MED ORDER — AEROCHAMBER PLUS FLO-VU MEDIUM MISC
1.0000 | Freq: Once | Status: AC
  Administered 2016-04-28: 1
  Filled 2016-04-28: qty 1

## 2016-04-28 MED ORDER — MAGNESIUM SULFATE 2 GM/50ML IV SOLN
2.0000 g | Freq: Once | INTRAVENOUS | Status: AC
  Administered 2016-04-28: 2 g via INTRAVENOUS
  Filled 2016-04-28: qty 50

## 2016-04-28 MED ORDER — METOCLOPRAMIDE HCL 5 MG/ML IJ SOLN
10.0000 mg | Freq: Once | INTRAMUSCULAR | Status: AC
  Administered 2016-04-28: 10 mg via INTRAVENOUS
  Filled 2016-04-28: qty 2

## 2016-04-28 NOTE — ED Triage Notes (Signed)
Patient reports productive cough, congestion, and SOB x1 week. Denies chest pain, abdominal pain, and N/V. Speaking in full sentences. Hx asthma

## 2016-04-28 NOTE — ED Provider Notes (Signed)
[facility] DEPT Provider Note   CSN: [id number] Arrival date & time: 04/28/16  1649     History   Chief Complaint Chief Complaint  Patient presents with  . Shortness of Breath    HPI Angela Patton is a 66 y.o. female presenting to the emergency department with worsening productive cough, congestion, shortness of breath over the past 3 days. The cough began one week ago and progressively worsened. Son is at bedside stating that he has noticed increased labored breathing. Patient states that it is worse at night and she has had to use multiple pillows to stay upright and breath. She has a history of asthma and has a rescue inhaler at home which she has used in combination with over-the-counter flu medications with only mild relief. Patient denies any use of antipyretics over the last 6 hours. She denies nausea, vomiting, chest pain, palpitations, leg swelling, calf pain, history of prolonged immobilization. Positive history of colon malignancy 10 years ago. She denies any other symptoms.  HPI  Past Medical History:  Diagnosis Date  . Asthma   . Cancer ([place])    colon   . Chest pain    Hospital 09/2010,  /   Stress echo October 29, 2010, vigorous LV function with stress.  All walls could not be assessed fully but it was felt that there was no ischemia.  . Coronary artery disease    MI per patient 2007 elsewhere, no records, pt. says cath  was OK  . Elevated CPK    Hospital 09/2010,  troponin normal  . Hyperlipidemia   . Hypertension   . MI (myocardial infarction)    2007    Patient Active Problem List   Diagnosis Date Noted  . Chest pain   . Hypertension   . Coronary artery disease   . Cancer ([place])   . Hyperlipidemia   . Elevated CPK     Past Surgical History:  Procedure Laterality Date  . ABDOMINAL HYSTERECTOMY    . CESAREAN SECTION    . COLON SURGERY     2004  . FOOT SURGERY     left 1999  . HERNIA REPAIR     three total, 2007  . SHOULDER SURGERY     left  shoulder 2008  . TOTAL HIP ARTHROPLASTY     right hip 2007    OB History    Gravida Para Term Preterm AB Living   3 3       3    SAB TAB Ectopic Multiple Live Births                   Home Medications    Prior to Admission medications   Medication Sig Start Date End Date Taking? Authorizing Provider  albuterol (PROVENTIL HFA;VENTOLIN HFA) 108 (90 BASE) MCG/ACT inhaler Inhale 1-2 puffs into the lungs every 6 (six) hours as needed for wheezing or shortness of breath. 09/10/14  Yes Drenda Freeze, MD  OVER THE COUNTER MEDICATION Take 20 mLs by mouth every 8 (eight) hours as needed (cold or flu symptoms).   Yes Historical Provider, MD  fluconazole (DIFLUCAN) 150 MG tablet Take 150 mg by mouth once. 04/11/16   Historical Provider, MD    Family History Family History  Problem Relation Age of Onset  . Cancer Mother     Bone  . Cancer Father     Colon  . Diabetes Sister   . Cancer Brother     Hodgkins  . Cancer Brother  Stomach  . Diabetes Sister   . Diabetes Sister     Social History Social History  Substance Use Topics  . Smoking status: Never Smoker  . Smokeless tobacco: Never Used  . Alcohol use Yes     Comment: Rare     Allergies   Penicillins   Review of Systems Review of Systems  Constitutional: Positive for chills.       Patient reports subjective fever, night sweats and chills.  HENT: Positive for congestion. Negative for ear pain, sinus pain, sinus pressure, sore throat and trouble swallowing.   Eyes: Negative for pain and visual disturbance.  Respiratory: Positive for cough, shortness of breath and wheezing. Negative for choking, chest tightness and stridor.   Cardiovascular: Negative for chest pain, palpitations and leg swelling.  Gastrointestinal: Positive for diarrhea. Negative for abdominal distention, abdominal pain, nausea and vomiting.  Genitourinary: Negative for dysuria and hematuria.  Musculoskeletal: Negative for arthralgias, back pain,  myalgias, neck pain and neck stiffness.  Skin: Negative for color change, pallor and rash.  Neurological: Positive for dizziness and headaches. Negative for seizures, syncope and weakness.       Patient reports headache and dizziness with coughing spells but not at this time.  All other systems reviewed and are negative.    Physical Exam Updated Vital Signs BP 172/76 (BP Location: Left Arm)   Pulse 101   Temp 98.2 F (36.8 C) (Oral)   Resp 23   SpO2 94%   Physical Exam  Constitutional: She appears well-developed and well-nourished. No distress.  Afebrile, nontoxic appearing, sitting in bed mild distress.   HENT:  Head: Normocephalic and atraumatic.  Eyes: Conjunctivae and EOM are normal. Right eye exhibits no discharge. Left eye exhibits no discharge. No scleral icterus.  Patient has pinpoint pupils denies any use of oxycodone from home  Neck: Normal range of motion. Neck supple.  Cardiovascular: Normal rate, regular rhythm and normal heart sounds.   No murmur heard. Pulmonary/Chest: Effort normal. She has wheezes. She has no rales. She exhibits no tenderness.  Abdominal: Soft. She exhibits no distension. There is no tenderness. There is no guarding.  Musculoskeletal: She exhibits no edema.  Lymphadenopathy:    She has no cervical adenopathy.  Neurological: She is alert.  Skin: Skin is warm and dry. She is not diaphoretic.  Psychiatric: She has a normal mood and affect.  Nursing note and vitals reviewed.    ED Treatments / Results  Labs (all labs ordered are listed, but only abnormal results are displayed) Labs Reviewed  COMPREHENSIVE METABOLIC PANEL - Abnormal; Notable for the following:       Result Value   Sodium 134 (*)    Glucose, Bld 106 (*)    AST 48 (*)    GFR calc non Af Amer 60 (*)    All other components within normal limits  CBC WITH DIFFERENTIAL/PLATELET  BRAIN NATRIURETIC PEPTIDE  I-STAT TROPOININ, ED    EKG  EKG  Interpretation  Date/Time:  Wednesday April 28 2016 16:59:05 EST Ventricular Rate:  80 PR Interval:    QRS Duration: 90 QT Interval:  351 QTC Calculation: 405 R Axis:   -48 Text Interpretation:  Sinus rhythm LAD, consider left anterior fascicular block Borderline T abnormalities, inferior leads Interpretation limited secondary to artifact Baseline wander No significant change since last tracing Confirmed by KNOTT MD, DANIEL [id number]) on 04/28/2016 6:28:32 PM       Radiology No results found.  Procedures Procedures (including critical care time)  Medications Ordered in ED Medications  albuterol (PROVENTIL) (2.5 MG/3ML) 0.083% nebulizer solution 5 mg (5 mg Nebulization Given 04/28/16 1659)  ipratropium-albuterol (DUONEB) 0.5-2.5 (3) MG/3ML nebulizer solution 3 mL (3 mLs Nebulization Given 04/28/16 1753)  acetaminophen (TYLENOL) tablet 1,000 mg (1,000 mg Oral Given 04/28/16 1901)  predniSONE (DELTASONE) tablet 60 mg (60 mg Oral Given 04/28/16 1944)     Initial Impression / Assessment and Plan / ED Course  I have reviewed the triage vital signs and the nursing notes.  Pertinent labs & imaging results that were available during my care of the patient were reviewed by me and considered in my medical decision making (see chart for details).  Clinical Course    66 y/o female with remote history of non-STEMI and asthma presenting to ED with one week of progressively worsening cough, worsening over the last 3 days, shortness of breath and wheezing. Patient came from urgent care where she refused nebulizing treatment because it makes her feel jittery. Chest x-ray was negative at urgent care and patient had results with her for review.  Patient was given albuterol nebs in triage before I saw her and she had improved but was still wheezing on my exam. Ordered duonebs. O2 saturation well. Heart rate 85. She was experiencing headache from coughing and requested something for pain. Gave her tylenol.    When reassessed she had ambulated to the bathroom and back and states that she has improved significantly since getting here, but it is still there. She was just given tylenol and was waiting for it to take effect. Her lung sounds significantly improved. Slight wheezing still appreciated. Patient was coughing and making noises throughout lung exam. She was able to speak multiple sentences without catching her breath afterwards.  She denies any leg swelling, calf pain, hx of DVT or Pe, no recent surgery.  Discussed strict return precautions. Patient was advised to return to the emergency department if experiencing any worsening of symptoms. She understood instructions and agreed with discharge plan.  Transferred patient care to Dr. Laneta Simmers who has also seen patient and agreed with assessment and plan.  Anticipate discharge home with steroid course and close PCP follow up.  Final Clinical Impressions(s) / ED Diagnoses   Final diagnoses:  Moderate asthma with exacerbation, unspecified whether persistent    New Prescriptions New Prescriptions   No medications on file     Dossie Der 04/28/16 1947    Leo Grosser, MD 04/28/16 2158

## 2016-04-28 NOTE — ED Notes (Signed)
Pt ambulatory and independent at discharge.  Verbalized understanding of discharge instructions 

## 2016-06-14 ENCOUNTER — Telehealth: Payer: Self-pay | Admitting: *Deleted

## 2016-06-14 NOTE — Telephone Encounter (Signed)
Pt called and left message in voicemail c/o vaginal odor, requesting medication, I tried to call pt back on both # in her chart and neither # worked.

## 2016-10-05 ENCOUNTER — Encounter: Payer: Self-pay | Admitting: Gynecology

## 2016-10-05 ENCOUNTER — Ambulatory Visit (INDEPENDENT_AMBULATORY_CARE_PROVIDER_SITE_OTHER): Payer: Medicare Other | Admitting: Gynecology

## 2016-10-05 VITALS — BP 124/80

## 2016-10-05 DIAGNOSIS — R350 Frequency of micturition: Secondary | ICD-10-CM | POA: Diagnosis not present

## 2016-10-05 DIAGNOSIS — N898 Other specified noninflammatory disorders of vagina: Secondary | ICD-10-CM

## 2016-10-05 LAB — URINALYSIS W MICROSCOPIC + REFLEX CULTURE
Bilirubin Urine: NEGATIVE
Casts: NONE SEEN [LPF]
Crystals: NONE SEEN [HPF]
GLUCOSE, UA: NEGATIVE
Hgb urine dipstick: NEGATIVE
Ketones, ur: NEGATIVE
LEUKOCYTES UA: NEGATIVE
Nitrite: NEGATIVE
PH: 6 (ref 5.0–8.0)
Yeast: NONE SEEN [HPF]

## 2016-10-05 LAB — WET PREP FOR TRICH, YEAST, CLUE
Clue Cells Wet Prep HPF POC: NONE SEEN
Trich, Wet Prep: NONE SEEN
Yeast Wet Prep HPF POC: NONE SEEN

## 2016-10-05 MED ORDER — METRONIDAZOLE 500 MG PO TABS: 500.0000 mg | ORAL_TABLET | Freq: Two times a day (BID) | ORAL | 0 refills | Status: DC

## 2016-10-05 NOTE — Addendum Note (Signed)
Addended by: Nelva Nay on: 10/05/2016 12:52 PM   Modules accepted: Orders

## 2016-10-05 NOTE — Progress Notes (Signed)
    Angela Patton 08/20/1950 [id number]        66 y.o.  G3P3 presents complaining of vaginal discharge with odor. Also notes some urinary frequency. No dysuria or urgency low back pain fever or chills. No vaginal itching but mostly irritative symptoms with discharge and odor. Had been treated both for bacterial vaginosis and yeast in the past.  Past medical history,surgical history, problem list, medications, allergies, family history and social history were all reviewed and documented in the EPIC chart.  Directed ROS with pertinent positives and negatives documented in the history of present illness/assessment and plan.  Exam: [name] assistant Vitals:   10/05/16 1226  BP: 124/80   General appearance:  Normal Abdomen obese without masses or tenderness Pelvic external BUS vagina with white discharge. Bimanual without masses or tenderness.   Assessment/Plan:  66 y.o. G3P3 with history and exam as above. Wet prep is consistent with bacterial vaginosis. Urine analysis appears contaminated with 7-12 squamous cells and many bacteria. 0-5 WBC 0-2 RBC. Will culture the urine and treat if grows out any significant bacteria. We'll treat the bacterial vaginosis with Flagyl 500 mg twice a day 7 days per her request. Alcohol avoidance reviewed. She will follow up if her symptoms persist, worsen or recur.    Anastasio Auerbach MD, 12:40 PM 10/05/2016

## 2016-10-05 NOTE — Patient Instructions (Signed)
Take the Flagyl medication twice daily for 7 days. Avoid alcohol while taking. Call if your vaginal irritation continues. The office will call you if the urine looks like a urinary tract infection on culture.

## 2016-10-06 LAB — URINE CULTURE: Organism ID, Bacteria: NO GROWTH

## 2016-10-20 ENCOUNTER — Telehealth: Payer: Self-pay

## 2016-10-20 NOTE — Telephone Encounter (Signed)
Patient called in voice mail stating she wanted a referral to orthopedist for her hip.  I checked with [facility] [phone]) and they do not require a referral to make an appointment. I called patient back to provide phone number but ph # she provided me is disconnected. I looked on her last appt and tried that cell ph number but no voice mail was set up there. We will have to wait for her to call  Back.

## 2017-02-07 ENCOUNTER — Other Ambulatory Visit: Payer: Self-pay | Admitting: Emergency Medicine

## 2017-02-07 ENCOUNTER — Ambulatory Visit
Admission: RE | Admit: 2017-02-07 | Discharge: 2017-02-07 | Disposition: A | Discharge status: Home / Self Care | Payer: Medicare Other | Source: Ambulatory Visit | Attending: Emergency Medicine | Admitting: Emergency Medicine

## 2017-02-07 DIAGNOSIS — M79604 Pain in right leg: Secondary | ICD-10-CM

## 2017-02-08 ENCOUNTER — Emergency Department (HOSPITAL_COMMUNITY): Payer: Medicare Other

## 2017-02-08 ENCOUNTER — Encounter (HOSPITAL_COMMUNITY): Payer: Self-pay | Admitting: *Deleted

## 2017-02-08 ENCOUNTER — Emergency Department (HOSPITAL_COMMUNITY)
Admission: EM | Admit: 2017-02-08 | Discharge: 2017-02-08 | Disposition: A | Discharge status: Home / Self Care | Payer: Medicare Other | Attending: Emergency Medicine | Admitting: Emergency Medicine

## 2017-02-08 DIAGNOSIS — Z85038 Personal history of other malignant neoplasm of large intestine: Secondary | ICD-10-CM | POA: Diagnosis not present

## 2017-02-08 DIAGNOSIS — I10 Essential (primary) hypertension: Secondary | ICD-10-CM | POA: Insufficient documentation

## 2017-02-08 DIAGNOSIS — R1012 Left upper quadrant pain: Secondary | ICD-10-CM | POA: Insufficient documentation

## 2017-02-08 DIAGNOSIS — I252 Old myocardial infarction: Secondary | ICD-10-CM | POA: Diagnosis not present

## 2017-02-08 DIAGNOSIS — I2510 Atherosclerotic heart disease of native coronary artery without angina pectoris: Secondary | ICD-10-CM | POA: Diagnosis not present

## 2017-02-08 DIAGNOSIS — Z79899 Other long term (current) drug therapy: Secondary | ICD-10-CM | POA: Insufficient documentation

## 2017-02-08 DIAGNOSIS — Z96641 Presence of right artificial hip joint: Secondary | ICD-10-CM | POA: Insufficient documentation

## 2017-02-08 DIAGNOSIS — J45909 Unspecified asthma, uncomplicated: Secondary | ICD-10-CM | POA: Diagnosis not present

## 2017-02-08 DIAGNOSIS — R1902 Left upper quadrant abdominal swelling, mass and lump: Secondary | ICD-10-CM

## 2017-02-08 DIAGNOSIS — R109 Unspecified abdominal pain: Secondary | ICD-10-CM | POA: Diagnosis present

## 2017-02-08 HISTORY — DX: Diverticulitis of intestine, part unspecified, without perforation or abscess without bleeding: K57.92

## 2017-02-08 LAB — URINALYSIS, ROUTINE W REFLEX MICROSCOPIC
BILIRUBIN URINE: NEGATIVE
Glucose, UA: NEGATIVE mg/dL
Hgb urine dipstick: NEGATIVE
Ketones, ur: NEGATIVE mg/dL
Leukocytes, UA: NEGATIVE
Nitrite: NEGATIVE
Protein, ur: NEGATIVE mg/dL
SPECIFIC GRAVITY, URINE: 1.015 (ref 1.005–1.030)
pH: 7 (ref 5.0–8.0)

## 2017-02-08 LAB — LIPASE, BLOOD: LIPASE: 24 U/L (ref 11–51)

## 2017-02-08 LAB — COMPREHENSIVE METABOLIC PANEL
ALK PHOS: 97 U/L (ref 38–126)
ALT: 22 U/L (ref 14–54)
AST: 28 U/L (ref 15–41)
Albumin: 3.6 g/dL (ref 3.5–5.0)
Anion gap: 5 (ref 5–15)
BILIRUBIN TOTAL: 0.6 mg/dL (ref 0.3–1.2)
BUN: 12 mg/dL (ref 6–20)
CALCIUM: 10 mg/dL (ref 8.9–10.3)
CO2: 26 mmol/L (ref 22–32)
Chloride: 104 mmol/L (ref 101–111)
Creatinine, Ser: 1.03 mg/dL — ABNORMAL HIGH (ref 0.44–1.00)
GFR calc Af Amer: >60 mL/min (ref >60)
GFR, EST NON AFRICAN AMERICAN: 55 mL/min — AB (ref >60)
Glucose, Bld: 95 mg/dL (ref 65–99)
POTASSIUM: 4 mmol/L (ref 3.5–5.1)
Sodium: 135 mmol/L (ref 135–145)
TOTAL PROTEIN: 7.4 g/dL (ref 6.5–8.1)

## 2017-02-08 LAB — CBC WITH DIFFERENTIAL/PLATELET
BASOS ABS: 0 10*3/uL (ref 0.0–0.1)
Basophils Relative: 0 %
Eosinophils Absolute: 0.3 10*3/uL (ref 0.0–0.7)
Eosinophils Relative: 4 %
HEMATOCRIT: 36.3 % (ref 36.0–46.0)
HEMOGLOBIN: 11.5 g/dL — AB (ref 12.0–15.0)
LYMPHS PCT: 36 %
Lymphs Abs: 2.2 10*3/uL (ref 0.7–4.0)
MCH: 29 pg (ref 26.0–34.0)
MCHC: 31.7 g/dL (ref 30.0–36.0)
MCV: 91.4 fL (ref 78.0–100.0)
MONO ABS: 0.7 10*3/uL (ref 0.1–1.0)
MONOS PCT: 11 %
NEUTROS ABS: 3.1 10*3/uL (ref 1.7–7.7)
Neutrophils Relative %: 49 %
Platelets: 225 10*3/uL (ref 150–400)
RBC: 3.97 MIL/uL (ref 3.87–5.11)
RDW: 13.5 % (ref 11.5–15.5)
WBC: 6.3 10*3/uL (ref 4.0–10.5)

## 2017-02-08 MED ORDER — FENTANYL CITRATE (PF) 100 MCG/2ML IJ SOLN
50.0000 ug | Freq: Once | INTRAMUSCULAR | Status: AC
  Administered 2017-02-08: 50 ug via INTRAVENOUS
  Filled 2017-02-08: qty 2

## 2017-02-08 MED ORDER — HYDROCODONE-ACETAMINOPHEN 5-325 MG PO TABS: 1.0000 | ORAL_TABLET | ORAL | 0 refills | Status: DC | PRN

## 2017-02-08 MED ORDER — IOPAMIDOL (ISOVUE-300) INJECTION 61%
INTRAVENOUS | Status: AC
  Administered 2017-02-08: 100 mL
  Filled 2017-02-08: qty 100

## 2017-02-08 NOTE — ED Provider Notes (Addendum)
[facility] EMERGENCY DEPARTMENT Provider Note   CSN: [id number] Arrival date & time: 02/08/17  1213     History   Chief Complaint Chief Complaint  Patient presents with  . Flank Pain    HPI Angela Patton is a 66 y.o. female.  HPI Patient presents with left-sided flank or abdominal pain. Began last night. No nausea vomiting diarrhea. No fevers. Took enemas after she thought she had a bowel movement. No real results. Continued pain. No dysuria. States it hurts when she breathes. No cough or shortness of breath however. Also hurts with movements. Has not had pain sickness before. No rash or fevers. Previous history of colon cancer. She is cancer free as far she knows. Last evaluated around a year ago. Past Medical History:  Diagnosis Date  . Asthma   . Cancer ([place])    colon   . Chest pain    Hospital 09/2010,  /   Stress echo October 29, 2010, vigorous LV function with stress.  All walls could not be assessed fully but it was felt that there was no ischemia.  . Coronary artery disease    MI per patient 2007 elsewhere, no records, pt. says cath  was OK  . Diverticulitis   . Elevated CPK    Hospital 09/2010,  troponin normal  . Hyperlipidemia   . Hypertension   . MI (myocardial infarction) [facility])    2007    Patient Active Problem List   Diagnosis Date Noted  . Chest pain   . Hypertension   . Coronary artery disease   . Cancer ([place])   . Hyperlipidemia   . Elevated CPK     Past Surgical History:  Procedure Laterality Date  . ABDOMINAL HYSTERECTOMY    . CESAREAN SECTION    . COLON SURGERY     2004  . FOOT SURGERY     left 1999  . HERNIA REPAIR     three total, 2007  . SHOULDER SURGERY     left shoulder 2008  . TOTAL HIP ARTHROPLASTY     right hip 2007    OB History    Gravida Para Term Preterm AB Living   3 3       3    SAB TAB Ectopic Multiple Live Births                   Home Medications    Prior to Admission medications     Medication Sig Start Date End Date Taking? Authorizing Provider  acetaminophen (TYLENOL) 500 MG tablet Take 1,000 mg by mouth every 6 (six) hours as needed for moderate pain.   Yes [provider]  albuterol (PROVENTIL HFA;VENTOLIN HFA) 108 (90 BASE) MCG/ACT inhaler Inhale 1-2 puffs into the lungs every 6 (six) hours as needed for wheezing or shortness of breath. Patient not taking: Reported on 02/08/2017 09/10/14   Drenda Freeze, MD  HYDROcodone-acetaminophen (NORCO/VICODIN) 5-325 MG tablet Take 1-2 tablets by mouth every 4 (four) hours as needed. 02/08/17   Davonna Belling, MD  metroNIDAZOLE (FLAGYL) 500 MG tablet Take 1 tablet (500 mg total) by mouth 2 (two) times daily. For 7 days.  Avoid alcohol while taking Patient not taking: Reported on 02/08/2017 10/05/16   Fontaine, Belinda Block, MD    Family History Family History  Problem Relation Age of Onset  . Cancer Mother        Bone  . Cancer Father        Colon  .  Diabetes Sister   . Cancer Brother        Hodgkins  . Cancer Brother        Stomach  . Diabetes Sister   . Diabetes Sister     Social History Social History  Substance Use Topics  . Smoking status: Never Smoker  . Smokeless tobacco: Never Used  . Alcohol use Yes     Comment: Rare     Allergies   Penicillins   Review of Systems Review of Systems  Constitutional: Negative for appetite change, chills and fever.  HENT: Negative for congestion.   Respiratory: Negative for shortness of breath and wheezing.   Cardiovascular: Negative for chest pain.  Gastrointestinal: Positive for abdominal pain and constipation. Negative for diarrhea, nausea and vomiting.  Genitourinary: Negative for dysuria and pelvic pain.  Musculoskeletal: Negative for gait problem.  Skin: Negative for wound.  Neurological: Negative for weakness.  Hematological: Negative for adenopathy.  Psychiatric/Behavioral: Negative for confusion.     Physical Exam Updated Vital  Signs BP (!) 148/74   Pulse 73   Temp 97.6 F (36.4 C) (Oral)   Resp 16   Ht 5\' 3"  (1.6 m)   Wt 95.3 kg (210 lb)   SpO2 95%   BMI 37.20 kg/m   Physical Exam  Constitutional: She appears well-developed.  HENT:  Head: Atraumatic.  Eyes: EOM are normal.  Neck: Neck supple.  Cardiovascular: Normal rate.   Pulmonary/Chest: Effort normal. She exhibits tenderness.  Some tenderness over left lateral lowest ribs. No rash. No crepitance or deformity. lungs are clear.   Abdominal: There is tenderness.  Tenderness to left upper quadrant and left flank. No rash. No skin changes. No rebound or guarding. Also epigastric tenderness. States she has a hernia here.  Musculoskeletal: She exhibits edema.  Mild edema of bilateral lower extremities.  Neurological: She is alert.  Skin: Capillary refill takes less than 2 seconds.     ED Treatments / Results  Labs (all labs ordered are listed, but only abnormal results are displayed) Labs Reviewed  CBC WITH DIFFERENTIAL/PLATELET - Abnormal; Notable for the following:       Result Value   Hemoglobin 11.5 (*)    All other components within normal limits  COMPREHENSIVE METABOLIC PANEL - Abnormal; Notable for the following:    Creatinine, Ser 1.03 (*)    GFR calc non Af Amer 55 (*)    All other components within normal limits  URINALYSIS, ROUTINE W REFLEX MICROSCOPIC  LIPASE, BLOOD    EKG  EKG Interpretation None       Radiology Dg Chest 2 View  Result Date: 02/08/2017 CLINICAL DATA:  Left lower quadrant pain EXAM: CHEST  2 VIEW COMPARISON:  09/09/2016 FINDINGS: The heart size and mediastinal contours are within normal limits. Both lungs are clear. Degenerative changes of the spine. IMPRESSION: No active cardiopulmonary disease. Electronically Signed   By: Donavan Foil M.D.   On: 02/08/2017 17:29   Ct Abdomen Pelvis W Contrast  Result Date: 02/08/2017 CLINICAL DATA:  Left flank pain for 1 day. Colon surgery for cancer. Hernia.  Hysterectomy. Hernia repair, including 2007. EXAM: CT ABDOMEN AND PELVIS WITH CONTRAST TECHNIQUE: Multidetector CT imaging of the abdomen and pelvis was performed using the standard protocol following bolus administration of intravenous contrast. CONTRAST:  <See Chart> ISOVUE-300 IOPAMIDOL (ISOVUE-300) INJECTION 61% COMPARISON:  01/21/2012 FINDINGS: Lower chest: Clear lung bases. Normal heart size without pericardial or pleural effusion. Upper normal cardiophrenic angle nodes, including a left-sided 6  mm node on image 12/series 3. This is enlarged since the prior. Hepatobiliary: Moderate hepatic steatosis. Small gallstones without acute cholecystitis or biliary duct dilatation. Pancreas: Normal, without mass or ductal dilatation. Spleen: Normal in size, without focal abnormality. Adrenals/Urinary Tract: Normal adrenal glands. Normal kidneys, without hydronephrosis. Degraded evaluation of the pelvis, secondary to beam hardening artifact from right hip arthroplasty. Grossly normal urinary bladder. Stomach/Bowel: Normal stomach, without wall thickening. Surgical changes in the region of the splenic flexure of the colon. Anterior and lateral to surgical sutures is an approximately 4.3 by 3.4 cm low-density mass. Example image 21/ series 3. This is slightly greater than fluid density and has mild adjacent interstitial thickening involving the left lateral Conal fascia. New since 01/21/2012. Normal small bowel caliber. Vascular/Lymphatic: Normal caliber of the aorta and branch vessels. No abdominopelvic adenopathy. Reproductive: Hysterectomy.  No adnexal mass. Other: No significant free fluid. Mild pelvic floor laxity. Prior ventral abdominal wall hernia repair. Residual or recurrent ventral pelvic wall laxity, including image 45/ series 3, similar. Probable scar tissue about the right paramidline abdominal wall including on image 41/series 3, similar. Musculoskeletal: Right hip arthroplasty. Left hip moderate  osteoarthritis. Degenerate sclerosis of the bilateral sacroiliac joints. Lumbosacral spondylosis. IMPRESSION: 1. Surgical sutures in the region of the splenic flexure of the colon. Adjacent complex low-density "Mass" with adjacent lateral Conal fascia wall thickening. This is new since 01/21/2012. If the surgical changes are new since that exam, this could simply represent postoperative hematoma or chronic abscess. If not, considerations would include recurrent/metastatic disease or less likely atypical appearance of complicated diverticulitis. Correlate with interval surgery since 01/21/2012. Consider sampling of this lesion and/or further evaluation with nonemergent outpatient PET. 2. Cholelithiasis. 3. Hepatic steatosis. 4. Degraded evaluation of the pelvis, secondary to beam hardening artifact from right hip arthroplasty. 5. Borderline cardiophrenic angle adenopathy is likely related to hepatic steatosis but nonspecific. Electronically Signed   By: Abigail Miyamoto M.D.   On: 02/08/2017 19:54   US Venous Img Lower Unilateral Right  Result Date: 02/07/2017 CLINICAL DATA:  Right lower extremity pain.  Evaluate for DVT. EXAM: RIGHT LOWER EXTREMITY VENOUS DOPPLER ULTRASOUND TECHNIQUE: Gray-scale sonography with graded compression, as well as color Doppler and duplex ultrasound were performed to evaluate the lower extremity deep venous systems from the level of the common femoral vein and including the common femoral, femoral, profunda femoral, popliteal and calf veins including the posterior tibial, peroneal and gastrocnemius veins when visible. The superficial great saphenous vein was also interrogated. Spectral Doppler was utilized to evaluate flow at rest and with distal augmentation maneuvers in the common femoral, femoral and popliteal veins. COMPARISON:  None. FINDINGS: Contralateral Common Femoral Vein: Respiratory phasicity is normal and symmetric with the symptomatic side. No evidence of thrombus. Normal  compressibility. Common Femoral Vein: No evidence of thrombus. Normal compressibility, respiratory phasicity and response to augmentation. Saphenofemoral Junction: No evidence of thrombus. Normal compressibility and flow on color Doppler imaging. Profunda Femoral Vein: No evidence of thrombus. Normal compressibility and flow on color Doppler imaging. Femoral Vein: No evidence of thrombus. Normal compressibility, respiratory phasicity and response to augmentation. Popliteal Vein: No evidence of thrombus. Normal compressibility, respiratory phasicity and response to augmentation. Calf Veins: No evidence of thrombus. Normal compressibility and flow on color Doppler imaging. Superficial Great Saphenous Vein: No evidence of thrombus. Normal compressibility. Venous Reflux:  None. Other Findings:  None. IMPRESSION: No evidence of DVT within the right lower extremity. Electronically Signed   By: Eldridge Abrahams.D.  On: 02/07/2017 16:24    Procedures Procedures (including critical care time)  Medications Ordered in ED Medications  iopamidol (ISOVUE-300) 61 % injection (100 mLs  Contrast Given 02/08/17 1914)  fentaNYL (SUBLIMAZE) injection 50 mcg (50 mcg Intravenous Given 02/08/17 2021)     Initial Impression / Assessment and Plan / ED Course  I have reviewed the triage vital signs and the nursing notes.  Pertinent labs & imaging results that were available during my care of the patient were reviewed by me and considered in my medical decision making (see chart for details).     Patient presents with left upper abdominal or flank pain. Worse in the last few days. Interferes with her life and has had difficulty moving with it. Labs reassuring CT scan shows possible mass or recurrence of her cancer. Has had no surgeries since the last CT 5 years ago and this mass is new since then. Patient's primary physician is a gynecologist. I feel patient may benefit from a short observation to establish pain control and  arrange either inpatient or short-term outpatient follow-up of the mass.  Discussed with Dr Lorin Mercy. Patients pain is now controlled and she can be followed as an outpatient.  Final Clinical Impressions(s) / ED Diagnoses   Final diagnoses:  Abdominal mass, LUQ (left upper quadrant)    New Prescriptions New Prescriptions   HYDROCODONE-ACETAMINOPHEN (NORCO/VICODIN) 5-325 MG TABLET    Take 1-2 tablets by mouth every 4 (four) hours as needed.     Davonna Belling, MD 02/08/17 [place]    Davonna Belling, MD 02/08/17 2107

## 2017-02-08 NOTE — Discharge Instructions (Signed)
Follow-up through primary care doctor for further evaluation of the upper abdominal mass. You may need either PET scan or a biopsy.

## 2017-02-08 NOTE — ED Triage Notes (Addendum)
To ED for eval of left flank pain and llq pain since last pm. States she thought she needed to have a BM so used 2 enemas. Minimal results. No vomiting. States she has felt hesitancy with urination over the past year per pt. Hx noted. No blood in stool.

## 2017-02-08 NOTE — ED Notes (Signed)
Pt to xray

## 2017-02-08 NOTE — ED Notes (Signed)
Patient transported to CT 

## 2017-02-09 ENCOUNTER — Encounter: Payer: Self-pay | Admitting: Gastroenterology

## 2017-02-09 ENCOUNTER — Telehealth: Payer: Self-pay | Admitting: Gastroenterology

## 2017-02-09 ENCOUNTER — Telehealth: Payer: Self-pay | Admitting: *Deleted

## 2017-02-09 ENCOUNTER — Ambulatory Visit (INDEPENDENT_AMBULATORY_CARE_PROVIDER_SITE_OTHER): Payer: Medicare Other | Admitting: Gastroenterology

## 2017-02-09 VITALS — BP 132/66 | HR 76 | Ht 64.0 in | Wt 209.4 lb

## 2017-02-09 DIAGNOSIS — R1012 Left upper quadrant pain: Secondary | ICD-10-CM

## 2017-02-09 DIAGNOSIS — R9389 Abnormal findings on diagnostic imaging of other specified body structures: Secondary | ICD-10-CM | POA: Insufficient documentation

## 2017-02-09 DIAGNOSIS — R1032 Left lower quadrant pain: Secondary | ICD-10-CM | POA: Insufficient documentation

## 2017-02-09 DIAGNOSIS — Z85038 Personal history of other malignant neoplasm of large intestine: Secondary | ICD-10-CM | POA: Insufficient documentation

## 2017-02-09 DIAGNOSIS — R1900 Intra-abdominal and pelvic swelling, mass and lump, unspecified site: Secondary | ICD-10-CM

## 2017-02-09 MED ORDER — METRONIDAZOLE 500 MG PO TABS: 500.0000 mg | ORAL_TABLET | Freq: Three times a day (TID) | ORAL | 0 refills | Status: DC

## 2017-02-09 MED ORDER — CIPROFLOXACIN HCL 500 MG PO TABS: 500.0000 mg | ORAL_TABLET | Freq: Two times a day (BID) | ORAL | 0 refills | Status: DC

## 2017-02-09 MED ORDER — HYDROCODONE-ACETAMINOPHEN 5-325 MG PO TABS: 1.0000 | ORAL_TABLET | ORAL | 0 refills | Status: DC | PRN

## 2017-02-09 NOTE — Telephone Encounter (Signed)
Pt wanting to make an apt here for follow up from ED visit last night. She presented with pelvic pain. Looking at documentation and imaging results pt was recommended to follow up with GI dr since history of colon cancer and mass found.  Pt states doesn't have a GI doctor here since colon cancer in [place].  I advised I will contact [facility] for apt. Also advised needs PCP as our office cannot be her PCP.  Spoke to Dominica at [facility] and will send referral over for appointment ASAP. KW CMA

## 2017-02-09 NOTE — Telephone Encounter (Signed)
Pt is coming today at 2 pm to see J.Zehr.

## 2017-02-09 NOTE — Telephone Encounter (Signed)
Angela Patton,     I am in the endo lab this afternoon and then out of the office until next Tuesday.  It does not sounds like it can wait that long. We do not know who this woman's prior GI doctor is, but the gynecologist is asking for help urgently.  Please contact her now and have her come to see one of our APPs this afternoon.

## 2017-02-09 NOTE — Progress Notes (Signed)
02/09/2017 Angela Patton [id number] [birth year]   HISTORY OF PRESENT ILLNESS:  This is a 67 year old female who was referred here by her GYN, Dr. Phineas Real, for evaluation regarding left sided abdominal pain and abnormal CT scan.  She has a remote history of colon cancer around 2004 in [place], [place]. We do not have any of those records and she is a poor historian. Also has GI history here in [place] with [facility] so we are trying to obtain those records. She thinks that she maybe had a colonoscopy last year.  Presents with sudden onset left upper quadrant abdominal pain 2 days ago. Was in the ED yesterday and CT scan showing mass in that area of unknown etiology as follows:  "Surgical sutures in the region of the splenic flexure of the colon. Adjacent complex low-density "Mass" with adjacent lateral Conal fascia wall thickening. This is new since 01/21/2012. If the surgical changes are new since that exam, this could simply represent postoperative hematoma or chronic abscess. If not, considerations would include recurrent/metastatic disease or less likely atypical appearance of complicated diverticulitis. Correlate with interval surgery since 01/21/2012. Consider sampling of this lesion and/or further evaluation with nonemergent outpatient PET."  She tells me that this pain began suddenly 2 days ago. Prior to that she was not having any abdominal pain.  She denies nausea, vomiting, fever, chills.  She says that she usually moves her bowels without issues.  No blood in stools.  CBC, CMP, lipase unrevealing.   Past Medical History:  Diagnosis Date  . Asthma   . Cancer ([place])    colon   . Chest pain    Hospital 09/2010,  /   Stress echo October 29, 2010, vigorous LV function with stress.  All walls could not be assessed fully but it was felt that there was no ischemia.  . Coronary artery disease    MI per patient 2007 elsewhere, no records, pt. says cath  was OK  . Diverticulitis   .  Elevated CPK    Hospital 09/2010,  troponin normal  . Hyperlipidemia   . Hypertension   . MI (myocardial infarction) ([place])    2007   Past Surgical History:  Procedure Laterality Date  . ABDOMINAL HYSTERECTOMY    . CESAREAN SECTION     x 2  . COLON SURGERY     2004  . FOOT SURGERY     left 1999  . HERNIA REPAIR     three total, 2007  . SHOULDER SURGERY     left shoulder 2008  . TOTAL HIP ARTHROPLASTY     right hip 2007    reports that she has never smoked. She has never used smokeless tobacco. She reports that she drinks alcohol. She reports that she does not use drugs. family history includes Bone cancer in her mother; Colon cancer in her father; Diabetes in her sister, sister, and sister; Hodgkin's lymphoma in her brother; Stomach cancer in her brother. Allergies  Allergen Reactions  . Penicillins Hives and Swelling    Has patient had a PCN reaction causing immediate rash, facial/tongue/throat swelling, SOB or lightheadedness with hypotension:  Has patient had a PCN reaction causing severe rash involving mucus membranes or skin necrosis:  Has patient had a PCN reaction that required hospitalization:  Has patient had a PCN reaction occurring within the last 10 years:  If all of the above answers are "NO", then may proceed with Cephalosporin use.  Outpatient Encounter Prescriptions as of 02/09/2017  Medication Sig  . acetaminophen (TYLENOL) 500 MG tablet Take 1,000 mg by mouth every 6 (six) hours as needed for moderate pain.  Marland Kitchen albuterol (PROVENTIL HFA;VENTOLIN HFA) 108 (90 BASE) MCG/ACT inhaler Inhale 1-2 puffs into the lungs every 6 (six) hours as needed for wheezing or shortness of breath.  Marland Kitchen HYDROcodone-acetaminophen (NORCO/VICODIN) 5-325 MG tablet Take 1-2 tablets by mouth every 4 (four) hours as needed.  . [DISCONTINUED] metroNIDAZOLE (FLAGYL) 500 MG tablet Take 1 tablet (500 mg total) by mouth 2 (two) times daily. For 7 days.  Avoid alcohol while taking (Patient  not taking: Reported on 02/08/2017)   No facility-administered encounter medications on file as of 02/09/2017.      REVIEW OF SYSTEMS  : All other systems reviewed and negative except where noted in the History of Present Illness.   PHYSICAL EXAM: BP 132/66   Pulse 76   Ht 5\' 4"  (1.626 m)   Wt 209 lb 6.4 oz (95 kg)   BMI 35.94 kg/m  General: Well developed black female in no acute distress Head: Normocephalic and atraumatic Eyes:  Sclerae anicteric, conjunctiva pink. Ears: Normal auditory acuity Lungs: Clear throughout to auscultation; no increased WOB. Heart: Regular rate and rhythm; no M/R/G. Abdomen: Soft, non-distended.  BS present.  Moderate TTP in LUQ. Musculoskeletal: Symmetrical with no gross deformities  Skin: No lesions on visible extremities Extremities: No edema  Neurological: Alert oriented x 4, grossly non-focal Psychological:  Alert and cooperative. Normal mood and affect  ASSESSMENT AND PLAN: *66 year old female with remote history of colon cancer around 2004 in [place], [place]. We do not have any of those records. Also has GI history here in [place] with [facility] so we are trying to obtain those records. Presents with sudden onset left upper quadrant abdominal pain 2 days ago. CT scan showing mass in that area of unknown etiology. Discussed with Dr. Carlean Purl. For now we are going to treat this as infectious source with Cipro and Flagyl for 10 days. We feel that if she had a fairly recent colonoscopy that it would be unlikely to be malignancy and presentation does not favor this. I will also give her some Vicodin for pain. She will call us back on Friday with an update on her symptoms and I will see her back in one week. Based on her clinical status we may need to repeat CT scan to check for resolution, etc. She knows that if she worsens in the interim then she needs to go to the emergency department.  Will follow a low residue diet.   CC:  Dr. Phineas Real

## 2017-02-09 NOTE — Telephone Encounter (Signed)
Sent to the attention of Doctor of the day 02/09/17 AM Dr.Danis's to advise as to urgency of scheduling.

## 2017-02-09 NOTE — Patient Instructions (Addendum)
If you are age 67 or older, your body mass index should be between 23-30. Your Body mass index is 35.94 kg/m. If this is out of the aforementioned range listed, please consider follow up with your Primary Care Provider.  If you are age 23 or younger, your body mass index should be between 19-25. Your Body mass index is 35.94 kg/m. If this is out of the aformentioned range listed, please consider follow up with your Primary Care Provider.   We have sent the following medications to your pharmacy for you to pick up at your convenience:  Cipro  Flagyl  Vicodin (Printed prescription)  Please call back on Friday and ask to speak to a nurse to give an update.  Please follow up with Janett Billow next Wednesday, October 24th at 2:30pm.  You have been given a handout on a low residue diet.  Thank you.

## 2017-02-09 NOTE — Telephone Encounter (Signed)
Routed to Dr. Danis. 

## 2017-02-09 NOTE — Progress Notes (Signed)
Thank you for sending this case to me (as DOD) and for discussing with Dr. Carlean Purl (supervising physician) in clinic today. I have reviewed the entire note, and the outlined plan seems appropriate.   Wilfrid Lund, MD

## 2017-02-11 ENCOUNTER — Telehealth: Payer: Self-pay | Admitting: Gastroenterology

## 2017-02-11 NOTE — Telephone Encounter (Signed)
Is her pain any better?  Worse?  About the same?  I do not expect that she will be pain free already, was just hoping for some improvement.  I am going to forward to Dr. [name].

## 2017-02-11 NOTE — Telephone Encounter (Signed)
Spoke to patient, she states she is still having abdominal pain. She had not had a bm in several days and took an enema last night. She said she had very little results, and what she did have was black in color. Denies N/V, no blood in stool. She is able to eat a little bit and keep it down.Please advise.

## 2017-02-11 NOTE — Telephone Encounter (Signed)
What are your thoughts on this?  I am having Almyra Free call her back to see if she's had any improvement at all.

## 2017-02-11 NOTE — Telephone Encounter (Signed)
She did state pain it is a "little" bit better. She sounded more concerned about constipation.

## 2017-02-12 NOTE — Telephone Encounter (Signed)
Please call her back to get an update on Monday in regards to her pain and constipation.  Thank you,  Jess

## 2017-02-14 NOTE — Telephone Encounter (Signed)
Jess,    Find me in clinic or [place] Wed and I can examine her when she is in to see you. - HD

## 2017-02-14 NOTE — Telephone Encounter (Signed)
Patient advised to continue Abx and keep her appointment for Wednesday with APP. Let her know that if she had a heating pad that she can try placing that on the area, but to be sure to only leave it on in 15 minute increments, making sure to give that area of skin a break from the heat.

## 2017-02-14 NOTE — Telephone Encounter (Signed)
Ok, continue Abx and followup with Alonza Bogus this Wednesday

## 2017-02-14 NOTE — Telephone Encounter (Signed)
Routed to DOD, Lenise Arena requested I contact patient today to see how she is doing. Patient did state that she did have relief from her constipation, denies any blood in stool. She is continuing to have LLQ pain, rated 7/10, no fever. Is able to eat and drink as normal. She has follow up appointment with Janett Billow on 10/24.

## 2017-02-16 ENCOUNTER — Encounter: Payer: Self-pay | Admitting: Gastroenterology

## 2017-02-16 ENCOUNTER — Ambulatory Visit (INDEPENDENT_AMBULATORY_CARE_PROVIDER_SITE_OTHER): Payer: Medicare Other | Admitting: Gastroenterology

## 2017-02-16 VITALS — BP 144/80 | HR 72 | Ht 62.75 in | Wt 210.0 lb

## 2017-02-16 DIAGNOSIS — R1012 Left upper quadrant pain: Secondary | ICD-10-CM

## 2017-02-16 DIAGNOSIS — R9389 Abnormal findings on diagnostic imaging of other specified body structures: Secondary | ICD-10-CM

## 2017-02-16 NOTE — Progress Notes (Signed)
Thank you for sending this case to me, and for having me see her with you in clinic today. I have reviewed the entire note, and the outlined plan is what we discussed.  My exam findings suggest a possible musculoskeletal component to this, but we must assume it is related to the CT findings for now.  Lack of fever and leukocytosis speak against infection such as diverticulitis, and she has not improved at all on Abx.  Further plan pending repeat CT scan results.   Wilfrid Lund, MD

## 2017-02-16 NOTE — Progress Notes (Signed)
02/16/2017 Angela Patton [id number] April 17, 1951   HISTORY OF PRESENT ILLNESS: Angela Patton is a pleasant 66 year old female who is here for one-week follow-up.  She was seen here by myself on 10/17 for complaints of left sided abdominal pain and a recent abnormal CT scan.  Please see my note from 10/17 for more details.  Due to the presentation of her symptoms we decided to treat this as an infectious process.  She has now been on Cipro and Flagyl for 6 or 7 days with no improvement.  Had some mild constipation last week, which she was able to resolve, but even with resolution of that she still had no improvement in her pain.  We received records from [facility] and it appears that her last colonoscopy was performed by Dr. Penelope Coop in January 2014 at which time the entire examined colon was normal with the anastomosis seen in the left colon.  Once again, she has remote history of colon cancer in about 2004, which was treated in [place], [place].   Past Medical History:  Diagnosis Date  . Asthma   . Cancer ([facility])    colon   . Chest pain    Hospital 09/2010,  /   Stress echo October 29, 2010, vigorous LV function with stress.  All walls could not be assessed fully but it was felt that there was no ischemia.  . Coronary artery disease    MI per patient 2007 elsewhere, no records, pt. says cath  was OK  . Diverticulitis   . Elevated CPK    Hospital 09/2010,  troponin normal  . Hyperlipidemia   . Hypertension   . MI (myocardial infarction) ([facility])    2007   Past Surgical History:  Procedure Laterality Date  . ABDOMINAL HYSTERECTOMY    . CESAREAN SECTION     x 2  . COLON SURGERY     2004  . FOOT SURGERY     left 1999  . HERNIA REPAIR     three total, 2007  . SHOULDER SURGERY     left shoulder 2008  . TOTAL HIP ARTHROPLASTY     right hip 2007    reports that she has never smoked. She has never used smokeless tobacco. She reports that she drinks alcohol. She reports that she does not use  drugs. family history includes Bone cancer in her mother; Colon cancer in her father; Diabetes in her sister, sister, and sister; Hodgkin's lymphoma in her brother; Stomach cancer in her brother. Allergies  Allergen Reactions  . Penicillins Hives and Swelling    Has patient had a PCN reaction causing immediate rash, facial/tongue/throat swelling, SOB or lightheadedness with hypotension:  Has patient had a PCN reaction causing severe rash involving mucus membranes or skin necrosis:  Has patient had a PCN reaction that required hospitalization:  Has patient had a PCN reaction occurring within the last 10 years:  If all of the above answers are "NO", then may proceed with Cephalosporin use.       Outpatient Encounter Prescriptions as of 02/16/2017  Medication Sig  . acetaminophen (TYLENOL) 500 MG tablet Take 1,000 mg by mouth every 6 (six) hours as needed for moderate pain.  Marland Kitchen albuterol (PROVENTIL HFA;VENTOLIN HFA) 108 (90 BASE) MCG/ACT inhaler Inhale 1-2 puffs into the lungs every 6 (six) hours as needed for wheezing or shortness of breath.  . ciprofloxacin (CIPRO) 500 MG tablet Take 1 tablet (500 mg total) by mouth 2 (two) times daily.  Marland Kitchen  HYDROcodone-acetaminophen (NORCO/VICODIN) 5-325 MG tablet Take 1-2 tablets by mouth every 4 (four) hours as needed.  . metroNIDAZOLE (FLAGYL) 500 MG tablet Take 1 tablet (500 mg total) by mouth 3 (three) times daily.   No facility-administered encounter medications on file as of 02/16/2017.      REVIEW OF SYSTEMS  : All other systems reviewed and negative except where noted in the History of Present Illness.   PHYSICAL EXAM: BP (!) 144/80 (BP Location: Left Arm, Patient Position: Sitting, Cuff Size: Normal)   Pulse 72   Ht 5' 2.75" (1.594 m) Comment: height measured without shoes  Wt 210 lb (95.3 kg)   BMI 37.50 kg/m  General: Well developed black female, appears very uncomfortable. Head: Normocephalic and atraumatic Eyes:  Sclerae anicteric,  conjunctiva pink. Ears: Normal auditory acuity Lungs: Clear throughout to auscultation; no increased WOB. Heart: Regular rate and rhythm; no M/R/G. Abdomen: Soft, non-distended.  BS present.  Moderate LUQ TTP, but also has significant superficial TTP along her left flank and into her back/paraspinal muscles. Musculoskeletal: Symmetrical with no gross deformities  Skin: No lesions on visible extremities Extremities: No edema  Neurological: Alert oriented x 4, grossly non-focal Psychological:  Alert and cooperative. Normal mood and affect  ASSESSMENT AND PLAN: *66 year old female with remote history of colon cancer around 2004 in [place], [place]. We do not have any of those records.  Presented with sudden onset left upper quadrant abdominal pain. CT scan showing mass in that area of unknown etiology.  Was treated with a course of cipro and flagyl, which she is still taking, but has seen no improvement with 6 or 7 days of those.  No other significant symptoms.  At this point she is very tender in her left upper quadrant, but also is exquisitely tender superficially on her left flank and into her left back over the paraspinal muscles.  We are unsure of the chronicity of this finding on CT scan and if this is in fact even what is causing her pain or if it was just seen incidentally on CT scan.  We are wondering if her pain is more musculoskeletal in origin.  We are going to plan for repeat CT scan of the abdomen and pelvis with contrast tomorrow, 10/25, to reevaluate the area in her abdomen.  In the interim we have asked her to begin taking 2-3 tablets of ibuprofen every 6-8 hours in order to treat inflammatory process.  We have given her samples of Nexium to take, 40 mg daily, while she is taking the anti-inflammatories.  Pending results of CT scan she may need colonoscopy in the near future.   CC:  No ref. provider found

## 2017-02-16 NOTE — Patient Instructions (Addendum)
Take ibuprofen 200 mg 2-3 tablets every 6-8 hours as needed.  Take Nexium 40 mg daily, take all of the samples given.    You have been scheduled for a CT scan of the abdomen and pelvis at [facility] ([address]---this is in the same building as [facility]).   You are scheduled on 02/17/17 at 3 pm. You should arrive 15 minutes prior to your appointment time for registration. Please follow the written instructions below on the day of your exam:  WARNING: IF YOU ARE ALLERGIC TO IODINE/X-RAY DYE, PLEASE NOTIFY RADIOLOGY IMMEDIATELY AT [phone]! YOU WILL BE GIVEN A 13 HOUR PREMEDICATION PREP.  1) Do not eat or drink anything after 11 am (4 hours prior to your test) 2) You have been given 2 bottles of oral contrast to drink. The solution may taste better if refrigerated, but do NOT add ice or any other liquid to this solution. Shake well before drinking.    Drink 1 bottle of contrast @ 1 pm (2 hours prior to your exam)  Drink 1 bottle of contrast @ 2 pm (1 hour prior to your exam)  You may take any medications as prescribed with a small amount of water except for the following: Metformin, Glucophage, Glucovance, Avandamet, Riomet, Fortamet, Actoplus Met, Janumet, Glumetza or Metaglip. The above medications must be held the day of the exam AND 48 hours after the exam.  The purpose of you drinking the oral contrast is to aid in the visualization of your intestinal tract. The contrast solution may cause some diarrhea. Before your exam is started, you will be given a small amount of fluid to drink. Depending on your individual set of symptoms, you may also receive an intravenous injection of x-ray contrast/dye. [facility] on [facility] for 30 minutes or longer, depending on the type of exam you are having performed.  This test typically takes 30-45 minutes to complete.  If you have any questions regarding your exam or if you need to reschedule, you may call the  CT department at [phone] between the hours of 8:00 am and 5:00 pm, Monday-Friday.  ________________________________________________________________________

## 2017-02-17 ENCOUNTER — Ambulatory Visit (INDEPENDENT_AMBULATORY_CARE_PROVIDER_SITE_OTHER)
Admission: RE | Admit: 2017-02-17 | Discharge: 2017-02-17 | Disposition: A | Discharge status: Home / Self Care | Payer: Medicare Other | Source: Ambulatory Visit | Attending: Gastroenterology | Admitting: Gastroenterology

## 2017-02-17 DIAGNOSIS — R1012 Left upper quadrant pain: Secondary | ICD-10-CM | POA: Diagnosis not present

## 2017-02-18 ENCOUNTER — Other Ambulatory Visit: Payer: Self-pay

## 2017-02-18 MED ORDER — METRONIDAZOLE 500 MG PO TABS: 500.0000 mg | ORAL_TABLET | Freq: Three times a day (TID) | ORAL | 0 refills | Status: DC

## 2017-02-18 MED ORDER — CIPROFLOXACIN HCL 500 MG PO TABS: 500.0000 mg | ORAL_TABLET | Freq: Two times a day (BID) | ORAL | 0 refills | Status: DC

## 2017-02-21 ENCOUNTER — Telehealth: Payer: Self-pay

## 2017-02-21 NOTE — Telephone Encounter (Signed)
Tried to contact patient to schedule a time for a pre-visit, unable to lvm, will try her later.

## 2017-02-22 ENCOUNTER — Telehealth: Payer: Self-pay

## 2017-02-22 ENCOUNTER — Other Ambulatory Visit: Payer: Self-pay

## 2017-02-22 NOTE — Telephone Encounter (Signed)
Patient aware of CT results and repeat course of antibiotics, 10/26. I have tried to reach patient twice, unable to leave voice message on 10/29 and 10/30. I have sent a letter with appointment dates/times for colonoscopy and pre-visit.

## 2017-02-24 ENCOUNTER — Other Ambulatory Visit: Payer: Self-pay | Admitting: Gynecology

## 2017-02-24 DIAGNOSIS — Z139 Encounter for screening, unspecified: Secondary | ICD-10-CM

## 2017-02-26 ENCOUNTER — Emergency Department (HOSPITAL_COMMUNITY)
Admission: EM | Admit: 2017-02-26 | Discharge: 2017-02-26 | Disposition: A | Discharge status: Home / Self Care | Payer: Medicare Other | Attending: Emergency Medicine | Admitting: Emergency Medicine

## 2017-02-26 ENCOUNTER — Encounter (HOSPITAL_COMMUNITY): Payer: Self-pay

## 2017-02-26 ENCOUNTER — Emergency Department (HOSPITAL_COMMUNITY): Payer: Medicare Other

## 2017-02-26 DIAGNOSIS — R1084 Generalized abdominal pain: Secondary | ICD-10-CM | POA: Diagnosis not present

## 2017-02-26 DIAGNOSIS — N200 Calculus of kidney: Secondary | ICD-10-CM | POA: Insufficient documentation

## 2017-02-26 DIAGNOSIS — Z85038 Personal history of other malignant neoplasm of large intestine: Secondary | ICD-10-CM | POA: Diagnosis not present

## 2017-02-26 DIAGNOSIS — Z96641 Presence of right artificial hip joint: Secondary | ICD-10-CM | POA: Diagnosis not present

## 2017-02-26 DIAGNOSIS — I10 Essential (primary) hypertension: Secondary | ICD-10-CM | POA: Insufficient documentation

## 2017-02-26 DIAGNOSIS — I2510 Atherosclerotic heart disease of native coronary artery without angina pectoris: Secondary | ICD-10-CM | POA: Diagnosis not present

## 2017-02-26 DIAGNOSIS — R1013 Epigastric pain: Secondary | ICD-10-CM | POA: Insufficient documentation

## 2017-02-26 DIAGNOSIS — I252 Old myocardial infarction: Secondary | ICD-10-CM | POA: Diagnosis not present

## 2017-02-26 LAB — COMPREHENSIVE METABOLIC PANEL
ALBUMIN: 3.6 g/dL (ref 3.5–5.0)
ALK PHOS: 90 U/L (ref 38–126)
ALT: 21 U/L (ref 14–54)
AST: 35 U/L (ref 15–41)
Anion gap: 9 (ref 5–15)
BILIRUBIN TOTAL: 0.4 mg/dL (ref 0.3–1.2)
BUN: 14 mg/dL (ref 6–20)
CALCIUM: 10 mg/dL (ref 8.9–10.3)
CO2: 22 mmol/L (ref 22–32)
CREATININE: 1.19 mg/dL — AB (ref 0.44–1.00)
Chloride: 105 mmol/L (ref 101–111)
GFR calc Af Amer: 54 mL/min — ABNORMAL LOW (ref >60)
GFR calc non Af Amer: 46 mL/min — ABNORMAL LOW (ref >60)
GLUCOSE: 91 mg/dL (ref 65–99)
Potassium: 4.1 mmol/L (ref 3.5–5.1)
Sodium: 136 mmol/L (ref 135–145)
TOTAL PROTEIN: 7 g/dL (ref 6.5–8.1)

## 2017-02-26 LAB — CBC
HEMATOCRIT: 37.9 % (ref 36.0–46.0)
Hemoglobin: 12 g/dL (ref 12.0–15.0)
MCH: 28.8 pg (ref 26.0–34.0)
MCHC: 31.7 g/dL (ref 30.0–36.0)
MCV: 91.1 fL (ref 78.0–100.0)
Platelets: 218 10*3/uL (ref 150–400)
RBC: 4.16 MIL/uL (ref 3.87–5.11)
RDW: 13.2 % (ref 11.5–15.5)
WBC: 6.2 10*3/uL (ref 4.0–10.5)

## 2017-02-26 LAB — URINALYSIS, ROUTINE W REFLEX MICROSCOPIC
BILIRUBIN URINE: NEGATIVE
Glucose, UA: NEGATIVE mg/dL
Hgb urine dipstick: NEGATIVE
KETONES UR: NEGATIVE mg/dL
Leukocytes, UA: NEGATIVE
NITRITE: NEGATIVE
PH: 8 (ref 5.0–8.0)
Protein, ur: NEGATIVE mg/dL
Specific Gravity, Urine: 1.011 (ref 1.005–1.030)

## 2017-02-26 LAB — LIPASE, BLOOD: Lipase: 27 U/L (ref 11–51)

## 2017-02-26 MED ORDER — ONDANSETRON HCL 4 MG/2ML IJ SOLN
4.0000 mg | Freq: Once | INTRAMUSCULAR | Status: AC
  Administered 2017-02-26: 4 mg via INTRAVENOUS
  Filled 2017-02-26: qty 2

## 2017-02-26 MED ORDER — SODIUM CHLORIDE 0.9 % IV BOLUS (SEPSIS)
500.0000 mL | Freq: Once | INTRAVENOUS | Status: AC
  Administered 2017-02-26: 500 mL via INTRAVENOUS

## 2017-02-26 MED ORDER — HYDROMORPHONE HCL 1 MG/ML IJ SOLN
1.0000 mg | Freq: Once | INTRAMUSCULAR | Status: AC
  Administered 2017-02-26: 1 mg via INTRAVENOUS
  Filled 2017-02-26: qty 1

## 2017-02-26 MED ORDER — OXYCODONE-ACETAMINOPHEN 5-325 MG PO TABS: 2.0000 | ORAL_TABLET | ORAL | 0 refills | Status: DC | PRN

## 2017-02-26 MED ORDER — ONDANSETRON 4 MG PO TBDP: 4.0000 mg | ORAL_TABLET | Freq: Three times a day (TID) | ORAL | 0 refills | Status: DC | PRN

## 2017-02-26 MED ORDER — TAMSULOSIN HCL 0.4 MG PO CAPS: 0.4000 mg | ORAL_CAPSULE | Freq: Every day | ORAL | 0 refills | Status: DC

## 2017-02-26 MED ORDER — IOPAMIDOL (ISOVUE-300) INJECTION 61%
INTRAVENOUS | Status: AC
  Administered 2017-02-26: 100 mL
  Filled 2017-02-26: qty 100

## 2017-02-26 NOTE — ED Provider Notes (Signed)
[facility] EMERGENCY DEPARTMENT Provider Note   CSN: [id number] Arrival date & time: 02/26/17  1239     History   Chief Complaint Chief Complaint  Patient presents with  . Abdominal Pain    HPI Angela Patton is a 66 y.o. female.  The history is provided by the patient. No language interpreter was used.  Abdominal Pain   This is a new problem. The current episode started more than 2 days ago. The problem occurs constantly. The problem has been gradually worsening. The pain is associated with eating. The pain is located in the generalized abdominal region and epigastric region. The quality of the pain is pressure-like and aching. The pain is severe. The symptoms are aggravated by eating. Nothing relieves the symptoms. Past workup includes GI consult.  Patient has a history of diverticulitis and a remote history of colon cancer.  She was seen in the emergency department on 02/08/17 with abdominal pain and had a CT scan showing a mass at the splenic flexure of her colon.  Patient's GI doctor has advised follow-up CT scan to evaluate further.  Patient reports the pain that she has been having for the past 2 days is in a different area and pain is worse than what she had previously  Past Medical History:  Diagnosis Date  . Asthma   . Cancer ([place])    colon   . Chest pain    Hospital 09/2010,  /   Stress echo October 29, 2010, vigorous LV function with stress.  All walls could not be assessed fully but it was felt that there was no ischemia.  . Coronary artery disease    MI per patient 2007 elsewhere, no records, pt. says cath  was OK  . Diverticulitis   . Elevated CPK    Hospital 09/2010,  troponin normal  . Hyperlipidemia   . Hypertension   . MI (myocardial infarction) [facility])    2007    Patient Active Problem List   Diagnosis Date Noted  . Left upper quadrant pain 02/09/2017  . Abnormal CT scan 02/09/2017  . History of colon cancer 02/09/2017  . Chest pain   .  Hypertension   . Coronary artery disease   . Cancer ([place])   . Hyperlipidemia   . Elevated CPK     Past Surgical History:  Procedure Laterality Date  . ABDOMINAL HYSTERECTOMY    . CESAREAN SECTION     x 2  . COLON SURGERY     2004  . FOOT SURGERY     left 1999  . HERNIA REPAIR     three total, 2007  . SHOULDER SURGERY     left shoulder 2008  . TOTAL HIP ARTHROPLASTY     right hip 2007    OB History    Gravida Para Term Preterm AB Living   3 3       3    SAB TAB Ectopic Multiple Live Births                   Home Medications    Prior to Admission medications   Medication Sig Start Date End Date Taking? Authorizing Provider  albuterol (PROVENTIL HFA;VENTOLIN HFA) 108 (90 BASE) MCG/ACT inhaler Inhale 1-2 puffs into the lungs every 6 (six) hours as needed for wheezing or shortness of breath. 09/10/14  Yes Drenda Freeze, MD  ciprofloxacin (CIPRO) 500 MG tablet Take 1 tablet (500 mg total) by mouth 2 (two) times daily.  02/18/17  Yes Danis, Kirke Corin, MD  metroNIDAZOLE (FLAGYL) 500 MG tablet Take 1 tablet (500 mg total) by mouth 3 (three) times daily. 02/18/17  Yes Danis, Kirke Corin, MD  HYDROcodone-acetaminophen (NORCO/VICODIN) 5-325 MG tablet Take 1-2 tablets by mouth every 4 (four) hours as needed. Patient not taking: Reported on 02/26/2017 02/09/17   Zehr, Laban Emperor, PA-C    Family History Family History  Problem Relation Age of Onset  . Bone cancer Mother   . Colon cancer Father   . Diabetes Sister   . Hodgkin's lymphoma Brother   . Stomach cancer Brother   . Diabetes Sister   . Diabetes Sister     Social History Social History  Substance Use Topics  . Smoking status: Never Smoker  . Smokeless tobacco: Never Used  . Alcohol use Yes     Comment: Rare     Allergies   Penicillins   Review of Systems Review of Systems  Gastrointestinal: Positive for abdominal pain.  All other systems reviewed and are negative.    Physical Exam Updated Vital  Signs BP (!) 126/55   Pulse 60   Temp 98.1 F (36.7 C) (Oral)   Resp 18   SpO2 98%   Physical Exam  Constitutional: She appears well-developed and well-nourished. No distress.  HENT:  Head: Normocephalic and atraumatic.  Right Ear: External ear normal.  Left Ear: External ear normal.  Eyes: Conjunctivae are normal.  Neck: Normal range of motion. Neck supple.  Cardiovascular: Normal rate, regular rhythm and normal heart sounds.   No murmur heard. Pulmonary/Chest: Effort normal and breath sounds normal. No respiratory distress.  Abdominal: Soft. There is tenderness.  No masses no guarding no rebound patient is tender in the mid epigastric and the left side of her upper abdomen.  Musculoskeletal: She exhibits no edema.  Neurological: She is alert.  Skin: Skin is warm and dry.  Psychiatric: She has a normal mood and affect.  Nursing note and vitals reviewed.    ED Treatments / Results  Labs (all labs ordered are listed, but only abnormal results are displayed) Labs Reviewed  COMPREHENSIVE METABOLIC PANEL - Abnormal; Notable for the following:       Result Value   Creatinine, Ser 1.19 (*)    GFR calc non Af Amer 46 (*)    GFR calc Af Amer 54 (*)    All other components within normal limits  URINALYSIS, ROUTINE W REFLEX MICROSCOPIC - Abnormal; Notable for the following:    APPearance CLOUDY (*)    All other components within normal limits  LIPASE, BLOOD  CBC   Patient's labs returned CBC is normal she has a normal white blood cell count and normal hemoglobin.  I obtained an acute abdominal series to evaluate for possible bowel obstruction.  She reports pain relief with Dilaudid and nausea relief with Zofran.  Will repeat CT scan. EKG  EKG Interpretation None       Radiology Dg Abd Acute W/chest  Result Date: 02/26/2017 CLINICAL DATA:  66 year old female with a history of chest pain EXAM: DG ABDOMEN ACUTE W/ 1V CHEST COMPARISON:  CT 02/17/2017, chest x-ray 02/08/2017  FINDINGS: Chest: Cardiomediastinal silhouette unchanged in size and contour. No evidence of central vascular congestion. No pneumothorax, pleural effusion, or confluent airspace disease. Abdomen: Gas within stomach small bowel and colon. No abnormal distention. Formed stool within the descending colon. No radiopaque foreign body. No unexpected soft tissue density. Multiple calcific density projects in the right  upper quadrant, compatible with cholelithiasis. Degenerative changes of the spine. Surgical changes of right hip arthroplasty. Advanced degenerative changes of the left hip. IMPRESSION: Chest: No radiographic evidence of acute cardiopulmonary disease. Abdomen: Nonobstructive bowel gas pattern. Cholelithiasis Electronically Signed   By: Corrie Mckusick D.O.   On: 02/26/2017 14:59    Procedures Procedures (including critical care time)  Medications Ordered in ED Medications  sodium chloride 0.9 % bolus 500 mL (0 mLs Intravenous Stopped 02/26/17 1534)  HYDROmorphone (DILAUDID) injection 1 mg (1 mg Intravenous Given 02/26/17 1418)  ondansetron (ZOFRAN) injection 4 mg (4 mg Intravenous Given 02/26/17 1417)  iopamidol (ISOVUE-300) 61 % injection (100 mLs  Contrast Given 02/26/17 1839)     Initial Impression / Assessment and Plan / ED Course  I have reviewed the triage vital signs and the nursing notes.  Pertinent labs & imaging results that were available during my care of the patient were reviewed by me and considered in my medical decision making (see chart for details).     CT scan shows patient has a 5 mm stone in the UVJ.  Radiologist also comments regarding inflammation at the splenic flexure.  Reports that overall the size of this area has decreased since previous CT scan  Final Clinical Impressions(s) / ED Diagnoses   Final diagnoses:  Generalized abdominal pain    New Prescriptions New Prescriptions   ONDANSETRON (ZOFRAN ODT) 4 MG DISINTEGRATING TABLET    Take 1 tablet (4 mg  total) by mouth every 8 (eight) hours as needed for nausea or vomiting.   OXYCODONE-ACETAMINOPHEN (PERCOCET/ROXICET) 5-325 MG TABLET    Take 2 tablets by mouth every 4 (four) hours as needed for severe pain.   TAMSULOSIN (FLOMAX) 0.4 MG CAPS CAPSULE    Take 1 capsule (0.4 mg total) by mouth daily.   An After Visit Summary was printed and given to the patient. Counseled patient on kidney stone.  She is advised to follow-up with Dr. Ivory Broad urologist on call I have advised her the area of concern at the splenic flexure in her colon appears to be decreasing and that she needs to continue to follow-up with her gastroenterologist as scheduled patient is given a prescription for Percocet Zofran and Flomax she is counseled on stone passage and the need for follow-up   Sidney Ace 02/26/17 2022    Davonna Belling, MD 02/27/17 1655

## 2017-02-26 NOTE — ED Notes (Signed)
Pt stable, ambulatory, states understanding of discharge instructions 

## 2017-02-26 NOTE — ED Notes (Signed)
Patient transported to X-ray 

## 2017-02-26 NOTE — ED Triage Notes (Signed)
Onset last night abd pain, urinary hesitancy.  Last BM 02-26-17 after enema.

## 2017-02-26 NOTE — ED Notes (Signed)
Patient transported to CT 

## 2017-02-28 ENCOUNTER — Telehealth: Payer: Self-pay | Admitting: *Deleted

## 2017-02-28 ENCOUNTER — Other Ambulatory Visit: Payer: Self-pay

## 2017-02-28 ENCOUNTER — Telehealth: Payer: Self-pay

## 2017-02-28 DIAGNOSIS — K5732 Diverticulitis of large intestine without perforation or abscess without bleeding: Secondary | ICD-10-CM

## 2017-02-28 NOTE — Telephone Encounter (Signed)
Spoke to patient she missed her pre-visit appointment today as she got lost, rescheduled to 11/7. Verbally went over with patient again, not to eat seeds, nuts, corn or pop corn this week. She will get her prep instructions on Wednesday. I have put through an ambulatory referral to check insurance coverage. Patient has our address and states she knows where we are located.

## 2017-02-28 NOTE — Telephone Encounter (Signed)
Patient called back and rescheduled PV. ?

## 2017-02-28 NOTE — Telephone Encounter (Signed)
Patient no show PV today. Called patient, no answer, line busy. Will try again later.

## 2017-03-02 ENCOUNTER — Ambulatory Visit (AMBULATORY_SURGERY_CENTER): Payer: Self-pay

## 2017-03-02 VITALS — Ht 63.0 in | Wt 213.0 lb

## 2017-03-02 DIAGNOSIS — R9389 Abnormal findings on diagnostic imaging of other specified body structures: Secondary | ICD-10-CM

## 2017-03-02 MED ORDER — SUPREP BOWEL PREP KIT 17.5-3.13-1.6 GM/177ML PO SOLN: 1.0000 | Freq: Once | ORAL | 0 refills | Status: AC

## 2017-03-02 NOTE — Progress Notes (Signed)
No allergies to eggs or soy No past problems with anesthesia No diet meds No home oxygen  Declined emmi 

## 2017-03-04 ENCOUNTER — Ambulatory Visit (AMBULATORY_SURGERY_CENTER): Payer: Medicare Other | Admitting: Gastroenterology

## 2017-03-04 ENCOUNTER — Encounter: Payer: Self-pay | Admitting: Gastroenterology

## 2017-03-04 ENCOUNTER — Telehealth: Payer: Self-pay | Admitting: Gastroenterology

## 2017-03-04 ENCOUNTER — Other Ambulatory Visit: Payer: Self-pay

## 2017-03-04 VITALS — BP 176/83 | HR 56 | Temp 97.8°F | Resp 15 | Ht 63.0 in | Wt 213.0 lb

## 2017-03-04 DIAGNOSIS — D122 Benign neoplasm of ascending colon: Secondary | ICD-10-CM | POA: Diagnosis not present

## 2017-03-04 DIAGNOSIS — R1012 Left upper quadrant pain: Secondary | ICD-10-CM

## 2017-03-04 DIAGNOSIS — R9389 Abnormal findings on diagnostic imaging of other specified body structures: Secondary | ICD-10-CM

## 2017-03-04 MED ORDER — SODIUM CHLORIDE 0.9 % IV SOLN: 500.0000 mL | INTRAVENOUS | Status: DC

## 2017-03-04 NOTE — Progress Notes (Signed)
Pt's states no medical or surgical changes since previsit or office visit. 

## 2017-03-04 NOTE — Progress Notes (Signed)
A and O x3. Report to RN. Tolerated MAC anesthesia well.

## 2017-03-04 NOTE — Patient Instructions (Signed)
Discharge instructions given. Handout on polyps. Resume previous medications. YOU HAD AN ENDOSCOPIC PROCEDURE TODAY AT THE [facility]:   Refer to the procedure report that was given to you for any specific questions about what was found during the examination.  If the procedure report does not answer your questions, please call your gastroenterologist to clarify.  If you requested that your care partner not be given the details of your procedure findings, then the procedure report has been included in a sealed envelope for you to review at your convenience later.  YOU SHOULD EXPECT: Some feelings of bloating in the abdomen. Passage of more gas than usual.  Walking can help get rid of the air that was put into your GI tract during the procedure and reduce the bloating. If you had a lower endoscopy (such as a colonoscopy or flexible sigmoidoscopy) you may notice spotting of blood in your stool or on the toilet paper. If you underwent a bowel prep for your procedure, you may not have a normal bowel movement for a few days.  Please Note:  You might notice some irritation and congestion in your nose or some drainage.  This is from the oxygen used during your procedure.  There is no need for concern and it should clear up in a day or so.  SYMPTOMS TO REPORT IMMEDIATELY:   Following lower endoscopy (colonoscopy or flexible sigmoidoscopy):  Excessive amounts of blood in the stool  Significant tenderness or worsening of abdominal pains  Swelling of the abdomen that is new, acute  Fever of 100F or higher   For urgent or emergent issues, a gastroenterologist can be reached at any hour by calling [phone].   DIET:  We do recommend a small meal at first, but then you may proceed to your regular diet.  Drink plenty of fluids but you should avoid alcoholic beverages for 24 hours.  ACTIVITY:  You should plan to take it easy for the rest of today and you should NOT DRIVE or use heavy  machinery until tomorrow (because of the sedation medicines used during the test).    FOLLOW UP: Our staff will call the number listed on your records the next business day following your procedure to check on you and address any questions or concerns that you may have regarding the information given to you following your procedure. If we do not reach you, we will leave a message.  However, if you are feeling well and you are not experiencing any problems, there is no need to return our call.  We will assume that you have returned to your regular daily activities without incident.  If any biopsies were taken you will be contacted by phone or by letter within the next 1-3 weeks.  Please call us at [phone] if you have not heard about the biopsies in 3 weeks.    SIGNATURES/CONFIDENTIALITY: You and/or your care partner have signed paperwork which will be entered into your electronic medical record.  These signatures attest to the fact that that the information above on your After Visit Summary has been reviewed and is understood.  Full responsibility of the confidentiality of this discharge information lies with you and/or your care-partner. 

## 2017-03-04 NOTE — Telephone Encounter (Signed)
Please send an ASAP referral to Dr. Nadeen Landau at [facility] for: LUQ pain and possible peri-colonic abscess.  Send JZ's two office notes and the colonoscopy report and the 3 most recent CT scan abdomen/pelvis reports (one in Nov, two in Oct). I will call Dr. Dema Severin as well to expedite the visit. Patient is aware this will happen. Please help coordinate.

## 2017-03-04 NOTE — Op Note (Signed)
[facility] Patient Name: Angela Patton Procedure Date: 03/04/2017 11:00 AM MRN: [id number] Endoscopist: Mallie Mussel L. Danis , MD Age: 66 Referring MD:  Date of Birth: 1951-02-03 Gender: Female Account #: [id number] Procedure:                Colonoscopy Indications:              Abdominal pain in the left upper quadrant, Abnormal                            CT of the GI tract with persistent abnormal LUQ                            collection of uncertain nature (possible                            inflammatory or abscess - normal WBC and little                            improvement after Abx)                           The patient also has a personal history of colon                            cancer with prior resection. Medicines:                Monitored Anesthesia Care Procedure:                Pre-Anesthesia Assessment:                           - Prior to the procedure, a History and Physical                            was performed, and patient medications and                            allergies were reviewed. The patient's tolerance of                            previous anesthesia was also reviewed. The risks                            and benefits of the procedure and the sedation                            options and risks were discussed with the patient.                            All questions were answered, and informed consent                            was obtained. Prior Anticoagulants: The patient has  taken no previous anticoagulant or antiplatelet                            agents. ASA Grade Assessment: III - A patient with                            severe systemic disease. After reviewing the risks                            and benefits, the patient was deemed in                            satisfactory condition to undergo the procedure.                           After obtaining informed consent, the colonoscope            was passed under direct vision. Throughout the                            procedure, the patient's blood pressure, pulse, and                            oxygen saturations were monitored continuously. The                            Colonoscope was introduced through the anus and                            advanced to the the cecum, identified by                            appendiceal orifice and ileocecal valve. The                            colonoscopy was performed without difficulty. The                            patient tolerated the procedure well. The quality                            of the bowel preparation was good. The ileocecal                            valve, appendiceal orifice, and rectum were                            photographed. The quality of the bowel preparation                            was evaluated using the BBPS [facility] Bowel                            Preparation Scale) with scores of: Right Colon = 2,  Transverse Colon = 2 and Left Colon = 2. The total                            BBPS score equals 6. After lavage. Scope In: 11:14:09 AM Scope Out: 11:27:04 AM Scope Withdrawal Time: 0 hours 8 minutes 4 seconds  Total Procedure Duration: 0 hours 12 minutes 55 seconds  Findings:                 The digital rectal exam findings include decreased                            sphincter tone.                           There was evidence of a prior end-to-side                            colo-colonic anastomosis at the splenic flexure.                            This was patent and was characterized by healthy                            appearing mucosa. The anastomosis was traversed.                           A 2 mm polyp was found in the ascending colon. The                            polyp was sessile. The polyp was removed with a                            cold biopsy forceps. Resection and retrieval were                             complete.                           Retroflexion in the rectum was not performed due to                            anatomy.                           The exam was otherwise without abnormality. Complications:            No immediate complications. Estimated Blood Loss:     Estimated blood loss: none. Impression:               - Decreased sphincter tone found on digital rectal                            exam.                           - Patent end-to-side colo-colonic anastomosis,  characterized by healthy appearing mucosa.                           - One 2 mm polyp in the ascending colon, removed                            with a cold biopsy forceps. Resected and retrieved.                           - The examination was otherwise normal.                           The nature of this CT scan finding is unclear, but                            it has a somewhat inflamatory appearance with air                            in it, raising suspicion for a loculated abscess.                            It is also adjacent to the ribcage, accounting for                            the patient's tenderness and pain worsened with                            movement and breathing. Recommendation:           - Patient has a contact number available for                            emergencies. The signs and symptoms of potential                            delayed complications were discussed with the                            patient. Return to normal activities tomorrow.                            Written discharge instructions were provided to the                            patient.                           - Resume previous diet.                           - Continue present medications.                           - Await pathology results.                           -  Repeat colonoscopy in 5 years for surveillance.                           - Refer to a surgeon at appointment  to be scheduled                            ASAP. Henry L. [name], MD 03/04/2017 11:41:33 AM This report has been signed electronically.

## 2017-03-04 NOTE — Telephone Encounter (Signed)
The referral has been made to [facility] .

## 2017-03-07 ENCOUNTER — Telehealth: Payer: Self-pay

## 2017-03-07 NOTE — Telephone Encounter (Signed)
  Follow up Call-  Call back number 03/04/2017  Post procedure Call Back phone  # [phone]  Permission to leave phone message Yes  Some recent data might be hidden     Patient questions:  Do you have a fever, pain , or abdominal swelling? No. Pain Score  0 *  Have you tolerated food without any problems? Yes.    Have you been able to return to your normal activities? Yes.    Do you have any questions about your discharge instructions: Diet   No. Medications  No. Follow up visit  No.  Do you have questions or concerns about your Care? No.  Actions: * If pain score is 4 or above: No action needed, pain <4.

## 2017-03-09 ENCOUNTER — Ambulatory Visit (INDEPENDENT_AMBULATORY_CARE_PROVIDER_SITE_OTHER): Payer: Medicare Other | Admitting: Gynecology

## 2017-03-09 ENCOUNTER — Encounter: Payer: Self-pay | Admitting: Gynecology

## 2017-03-09 VITALS — BP 122/80 | Ht 63.0 in | Wt 209.0 lb

## 2017-03-09 DIAGNOSIS — N952 Postmenopausal atrophic vaginitis: Secondary | ICD-10-CM

## 2017-03-09 DIAGNOSIS — Z1272 Encounter for screening for malignant neoplasm of vagina: Secondary | ICD-10-CM

## 2017-03-09 DIAGNOSIS — Z01411 Encounter for gynecological examination (general) (routine) with abnormal findings: Secondary | ICD-10-CM | POA: Diagnosis not present

## 2017-03-09 DIAGNOSIS — Z85038 Personal history of other malignant neoplasm of large intestine: Secondary | ICD-10-CM | POA: Diagnosis not present

## 2017-03-09 DIAGNOSIS — Z9189 Other specified personal risk factors, not elsewhere classified: Secondary | ICD-10-CM

## 2017-03-09 DIAGNOSIS — Z13820 Encounter for screening for osteoporosis: Secondary | ICD-10-CM

## 2017-03-09 MED ORDER — METRONIDAZOLE 500 MG PO TABS: 500.0000 mg | ORAL_TABLET | Freq: Two times a day (BID) | ORAL | 0 refills | Status: DC

## 2017-03-09 NOTE — Progress Notes (Signed)
    Dennisha Mouser 1950-05-21 [id number]        66 y.o.  G3P3 for breast and pelvic exam  Past medical history,surgical history, problem list, medications, allergies, family history and social history were all reviewed and documented as reviewed in the EPIC chart.  ROS:  Performed with pertinent positives and negatives included in the history, assessment and plan.   Additional significant findings : None   Exam: [name] assistant Vitals:   03/09/17 0955  BP: 122/80  Weight: 209 lb (94.8 kg)  Height: 5\' 3"  (1.6 m)   Body mass index is 37.02 kg/m.  General appearance:  Normal affect, orientation and appearance. Skin: Grossly normal HEENT: Without gross lesions.  No cervical or supraclavicular adenopathy. Thyroid normal.  Lungs:  Clear without wheezing, rales or rhonchi Cardiac: RR, without RMG Abdominal:  Soft, nontender, without masses, guarding, rebound, organomegaly or hernia Breasts:  Examined lying and sitting without masses, retractions, discharge or axillary adenopathy. Pelvic:  Ext, BUS, Vagina: With atrophic changes.  Pap smear of cuff done  Adnexa: Without masses or tenderness    Anus and perineum: Normal   Rectovaginal: Normal sphincter tone without palpated masses or tenderness.    Assessment/Plan:  66 y.o. G3P3 female for rest and pelvic exam.   1. Postmenopausal/atrophic genital changes.  Status post TAH 2005 at the time of her colon surgery.  No significant hot flushes, night sweats or vaginal dryness. 2. Intermittent vaginal odor.  No significant discharge or irritation on exam.  Had been treated for BV in the past with good results.  We will go ahead and cover with Flagyl 500 mg twice daily times 7 days.  Alcohol avoidance reviewed.  Follow-up if symptoms persist. 3. Mammography scheduled and patient will follow-up for this.  Breast exam normal today. 4. Pap smear 2015.  Pap smear of vaginal cuff done today.  No history of significant abnormal Pap smears.   Options to stop screening based on current screening guidelines, age and hysterectomy history reviewed.  Will readdress on an annual basis. 5. Colonoscopy 2018.  Repeat at their recommended interval. 6. DEXA 2012 normal.  Was to schedule follow-up DEXA last year but failed to do so.  Again recommended and ordered DEXA now she will schedule this in follow-up for this. 7. Health maintenance.  Recent hospitalization with findings of renal lithiasis and complex fluid collection at the anastomosis site of her prior colon surgery.  This appears to be getting smaller on serial CT scans.  She has follow-up appointment scheduled in reference to this and will follow up as arranged.  We will follow-up for bone density.  Follow-up in 1 year for annual exam.   Anastasio Auerbach MD, 10:24 AM 03/09/2017

## 2017-03-09 NOTE — Addendum Note (Signed)
Addended by: Nelva Nay on: 03/09/2017 10:36 AM   Modules accepted: Orders

## 2017-03-09 NOTE — Patient Instructions (Signed)
Follow-up for the bone density as scheduled. 

## 2017-03-11 ENCOUNTER — Telehealth: Payer: Self-pay

## 2017-03-11 LAB — PAP IG W/ RFLX HPV ASCU

## 2017-03-11 NOTE — Telephone Encounter (Signed)
Patient has appointment with Dr. Dema Severin on 03/22/17 at 9:45 am, patient advised.

## 2017-03-11 NOTE — Telephone Encounter (Signed)
Understood - I spoke with Dr. Dema Severin about Keyli's case.  ED before then if needed for severe worsening of pain or high fever.

## 2017-03-16 ENCOUNTER — Ambulatory Visit
Admission: RE | Admit: 2017-03-16 | Discharge: 2017-03-16 | Disposition: A | Discharge status: Home / Self Care | Payer: Medicare Other | Source: Ambulatory Visit | Attending: Gynecology | Admitting: Gynecology

## 2017-03-16 ENCOUNTER — Other Ambulatory Visit: Payer: Self-pay | Admitting: Gynecology

## 2017-03-16 DIAGNOSIS — N644 Mastodynia: Secondary | ICD-10-CM

## 2017-03-16 DIAGNOSIS — Z139 Encounter for screening, unspecified: Secondary | ICD-10-CM

## 2017-03-21 ENCOUNTER — Other Ambulatory Visit: Payer: Self-pay | Admitting: Surgery

## 2017-03-21 DIAGNOSIS — Z9049 Acquired absence of other specified parts of digestive tract: Secondary | ICD-10-CM

## 2017-03-25 ENCOUNTER — Other Ambulatory Visit: Payer: Medicare Other

## 2017-03-28 ENCOUNTER — Other Ambulatory Visit: Payer: Medicare Other

## 2017-03-31 ENCOUNTER — Other Ambulatory Visit: Payer: Self-pay | Admitting: Gynecology

## 2017-03-31 ENCOUNTER — Ambulatory Visit (INDEPENDENT_AMBULATORY_CARE_PROVIDER_SITE_OTHER): Payer: Medicare Other

## 2017-03-31 DIAGNOSIS — Z780 Asymptomatic menopausal state: Secondary | ICD-10-CM

## 2017-03-31 DIAGNOSIS — Z13820 Encounter for screening for osteoporosis: Secondary | ICD-10-CM

## 2017-04-01 ENCOUNTER — Ambulatory Visit: Payer: Medicare Other

## 2017-04-01 ENCOUNTER — Ambulatory Visit
Admission: RE | Admit: 2017-04-01 | Discharge: 2017-04-01 | Disposition: A | Discharge status: Home / Self Care | Payer: Medicare Other | Source: Ambulatory Visit | Attending: Gynecology | Admitting: Gynecology

## 2017-04-01 DIAGNOSIS — N644 Mastodynia: Secondary | ICD-10-CM

## 2018-03-16 ENCOUNTER — Ambulatory Visit (INDEPENDENT_AMBULATORY_CARE_PROVIDER_SITE_OTHER): Payer: Medicare Other | Admitting: Gynecology

## 2018-03-16 ENCOUNTER — Encounter: Payer: Self-pay | Admitting: Gynecology

## 2018-03-16 VITALS — BP 124/80

## 2018-03-16 DIAGNOSIS — R3911 Hesitancy of micturition: Secondary | ICD-10-CM | POA: Diagnosis not present

## 2018-03-16 DIAGNOSIS — R21 Rash and other nonspecific skin eruption: Secondary | ICD-10-CM | POA: Diagnosis not present

## 2018-03-16 MED ORDER — NYSTATIN 100000 UNIT/GM EX POWD: Freq: Four times a day (QID) | CUTANEOUS | 1 refills | Status: DC

## 2018-03-16 MED ORDER — NYSTATIN-TRIAMCINOLONE 100000-0.1 UNIT/GM-% EX OINT: 1.0000 "application " | TOPICAL_OINTMENT | Freq: Two times a day (BID) | CUTANEOUS | 0 refills | Status: DC

## 2018-03-16 NOTE — Progress Notes (Signed)
    Angela Patton 11/03/1950 [id number]        67 y.o.  G3P3 presents with 2 separate complaints:  1. Skin rash over the last 2 weeks in the folds of her panniculus.  Very itchy.  She is been putting bacitracin on it.  Seems to be getting worse.  No fever or chills.  No vaginal complaints such as discharge itching or odor. 2. Urine hesitancy.  Goes little amounts and then has to go again afterwards.  No frequency dysuria urgency low back pain fever or chills.  Past medical history,surgical history, problem list, medications, allergies, family history and social history were all reviewed and documented in the EPIC chart.  Directed ROS with pertinent positives and negatives documented in the history of present illness/assessment and plan.  Exam: Vitals:   03/16/18 1131  BP: 124/80   General appearance:  Normal Abdomen soft nontender without masses guarding rebound.  Classic fungal appearing rash in the folds of her panniculus right side of the abdomen worse than left.  Does not appear to extend down into her groin folds.  Assessment/Plan:  67 y.o. G3P3 with:  1. 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  Start with Mycolog cream twice daily until improved.  Switch to nystatin powder 2 times daily as needed until completely gone then as needed for any recurrence.  Follow-up if rash worsens or fails to improve. 2. Urine hesitancy.  No other signs of UTI.  Will check urine analysis.  Push fluids for now.  She will follow-up if symptoms continue.  Will treat for UTI if urine positive.    Anastasio Auerbach MD, 11:42 AM 03/16/2018

## 2018-03-16 NOTE — Patient Instructions (Signed)
Apply the skin cream twice daily until the rash improves.  You can then switch to the powder twice daily as needed.

## 2018-03-17 ENCOUNTER — Telehealth: Payer: Self-pay | Admitting: *Deleted

## 2018-03-17 MED ORDER — TRIAMCINOLONE ACETONIDE 0.1 % EX OINT: 1.0000 "application " | TOPICAL_OINTMENT | Freq: Two times a day (BID) | CUTANEOUS | 0 refills | Status: DC

## 2018-03-17 MED ORDER — NYSTATIN 100000 UNIT/GM EX OINT: 1.0000 "application " | TOPICAL_OINTMENT | Freq: Two times a day (BID) | CUTANEOUS | 0 refills | Status: DC

## 2018-03-17 NOTE — Telephone Encounter (Signed)
nystating-triamcinolone ointment is not covered by insurance, will send 2 separate Rx for nystatin and triamcinolone will be cheaper for patient. I will cancel Rx for combination ointment.  Rx sent unable to relay to patient her voicemail is full.

## 2018-03-18 LAB — URINALYSIS, COMPLETE W/RFL CULTURE
Bilirubin Urine: NEGATIVE
GLUCOSE, UA: NEGATIVE
HGB URINE DIPSTICK: NEGATIVE
HYALINE CAST: NONE SEEN /LPF
Ketones, ur: NEGATIVE
Leukocyte Esterase: NEGATIVE
Nitrites, Initial: NEGATIVE
PROTEIN: NEGATIVE
Specific Gravity, Urine: 1.027 (ref 1.001–1.03)
pH: 5.5 (ref 5.0–8.0)

## 2018-03-18 LAB — URINE CULTURE
MICRO NUMBER:: 91410912
SPECIMEN QUALITY: ADEQUATE

## 2018-03-18 LAB — CULTURE INDICATED

## 2018-05-22 ENCOUNTER — Telehealth: Payer: Self-pay | Admitting: *Deleted

## 2018-05-22 NOTE — Telephone Encounter (Signed)
Patient called asking if she could drop off her handicap sticker to be filled out by Dr. Phineas Real. I asked about PCP, patient said Dr. Phineas Real is PCP, reports he has done in past. I explained she is overdue for annual exam and needs to schedule.

## 2018-06-15 ENCOUNTER — Other Ambulatory Visit: Payer: Self-pay | Admitting: Gynecology

## 2018-06-16 ENCOUNTER — Telehealth: Payer: Self-pay | Admitting: *Deleted

## 2018-06-16 MED ORDER — NYSTATIN 100000 UNIT/GM EX OINT: 1.0000 "application " | TOPICAL_OINTMENT | Freq: Two times a day (BID) | CUTANEOUS | 2 refills | Status: DC

## 2018-06-16 NOTE — Telephone Encounter (Signed)
Patient called requesting refill on nystatin ointment for "Classic fungal rash in her panniculus". Please advise

## 2018-06-16 NOTE — Telephone Encounter (Signed)
Rx sent 

## 2018-06-16 NOTE — Telephone Encounter (Signed)
Okay to refill x2 

## 2018-06-29 ENCOUNTER — Other Ambulatory Visit: Payer: Self-pay | Admitting: Gynecology

## 2018-11-04 ENCOUNTER — Other Ambulatory Visit: Payer: Self-pay | Admitting: Gynecology

## 2018-12-19 ENCOUNTER — Telehealth: Payer: Self-pay | Admitting: *Deleted

## 2018-12-19 NOTE — Telephone Encounter (Signed)
Patient called and left message requesting refill on medication. I assume she is wanting nystatin ointment, overdue for annual exam. I asked patient to call me.

## 2019-01-31 ENCOUNTER — Encounter: Payer: Self-pay | Admitting: Gynecology

## 2019-03-13 IMAGING — CT CT ABD-PELV W/ CM
2 of 5 series · 15 of 46 positions shown, 17 images · IV contrast (Omni 300)
Comparison: 01/21/2012

CLINICAL DATA: Left flank pain for 1 day. Colon surgery for cancer.
Hernia. Hysterectomy. Hernia repair, including 7006.

EXAM:
CT ABDOMEN AND PELVIS WITH CONTRAST
TECHNIQUE: Multidetector CT imaging of the abdomen and pelvis was performed
using the standard protocol following bolus administration of
intravenous contrast.
CONTRAST:  <See Chart> [id number] IOPAMIDOL ([id number]) INJECTION
61%

[Series 3: a/p w/ 5mm · axial · 0.91mm/px · z∈[+1467,+1827]mm · 12 of 82 slices shown, 14 images]
[im 5/82  soft-tissue]
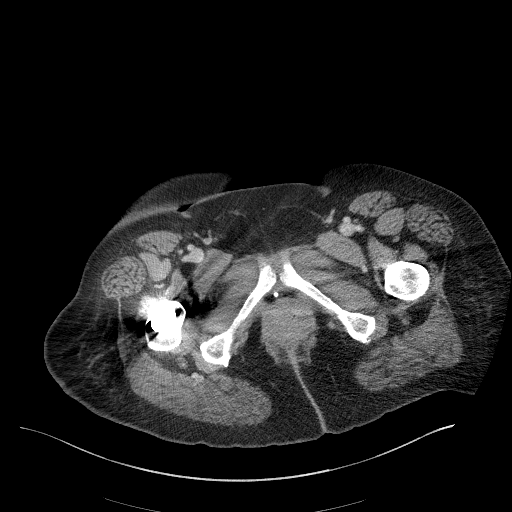
[im 5/82  bone]
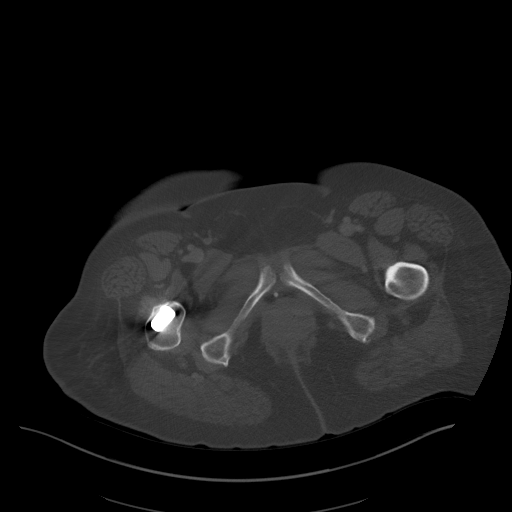
[im 15/82  soft-tissue]
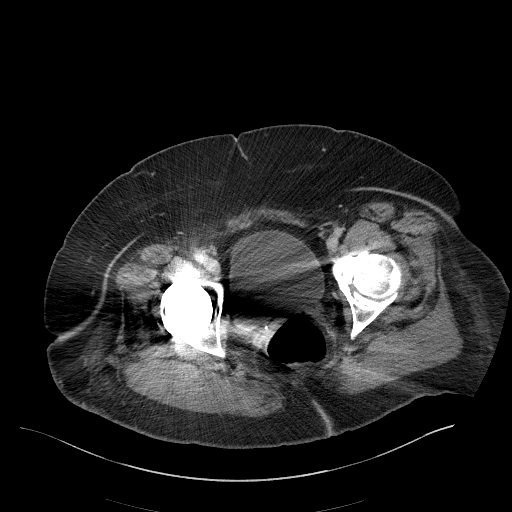
[im 20/82  soft-tissue]
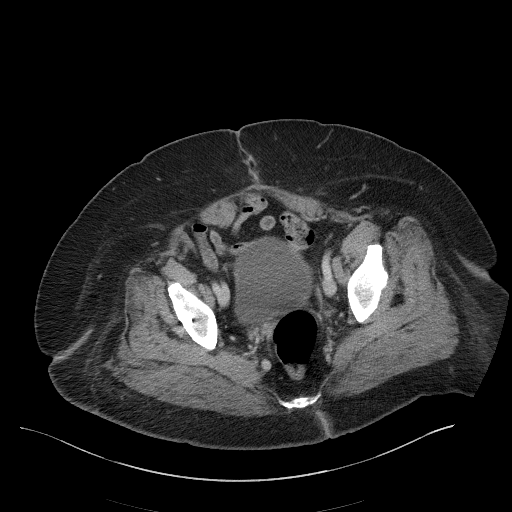
[im 24/82  soft-tissue]
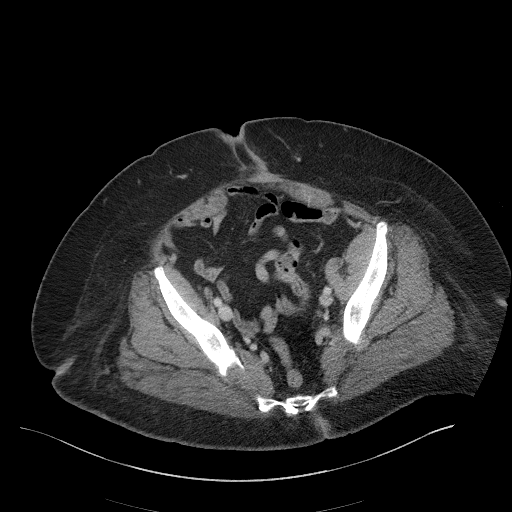
[im 34/82  soft-tissue]
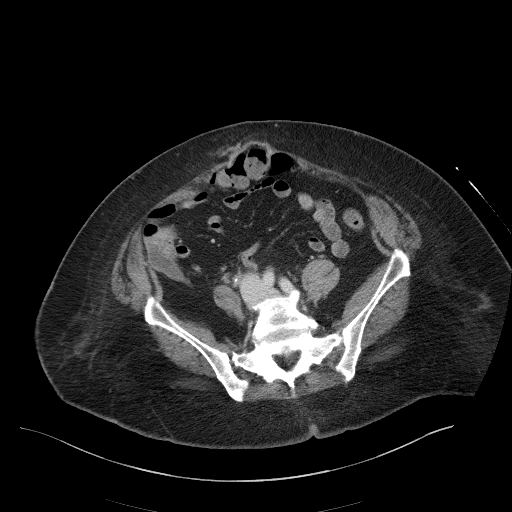
[im 39/82  soft-tissue]
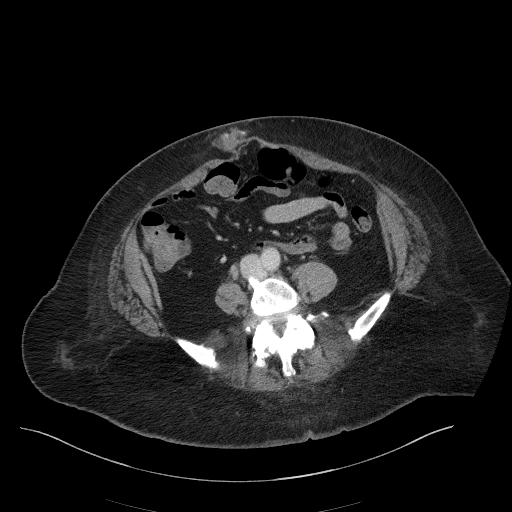
[im 43/82  soft-tissue]
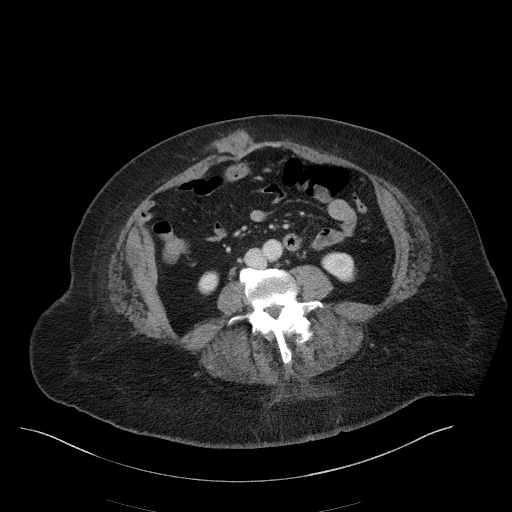
[im 53/82  soft-tissue]
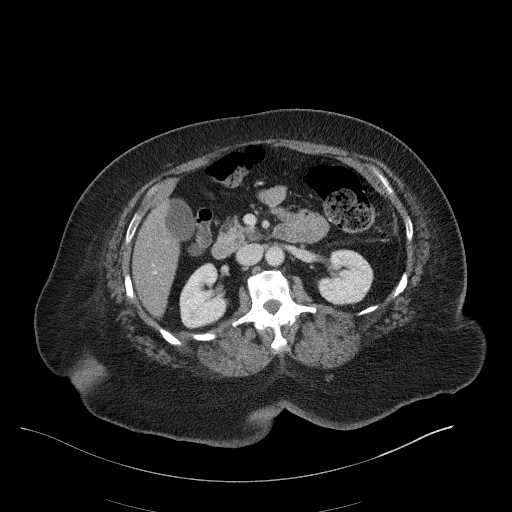
[im 58/82  soft-tissue]
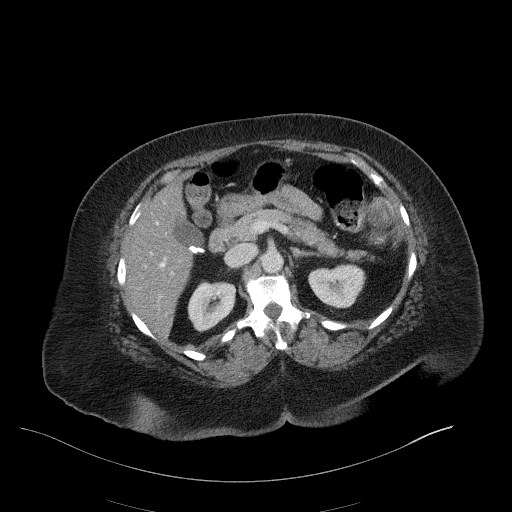
[im 58/82  bone]
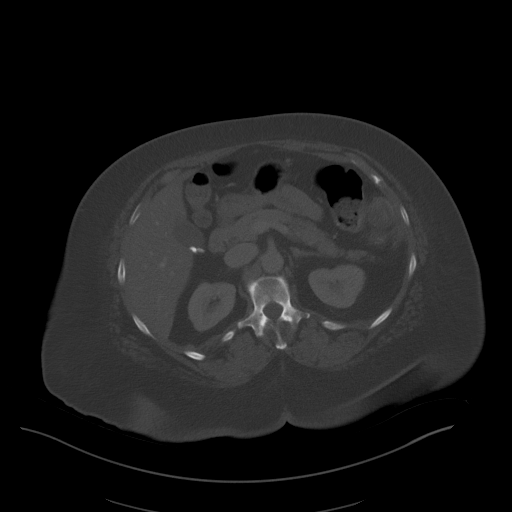
[im 62/82  soft-tissue]
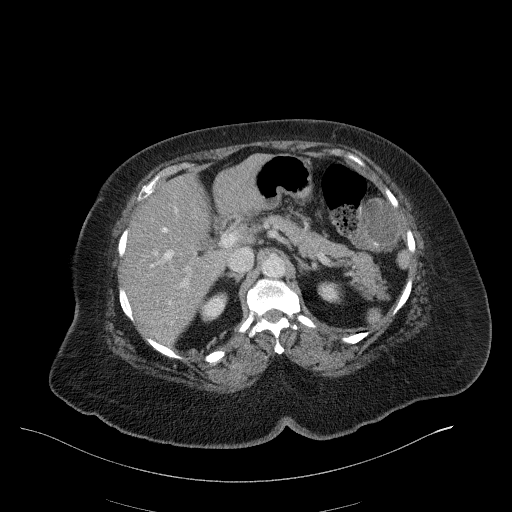
[im 72/82  soft-tissue]
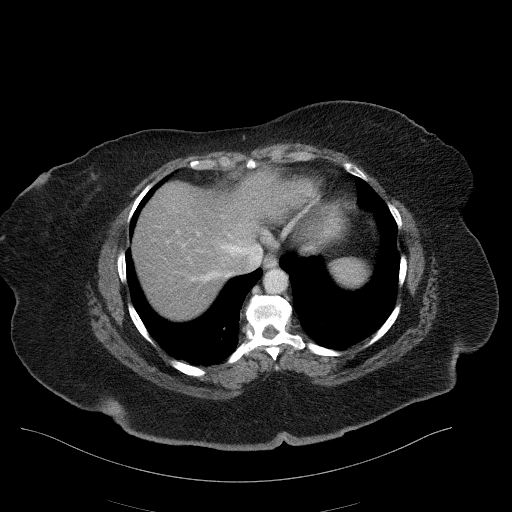
[im 77/82  soft-tissue]
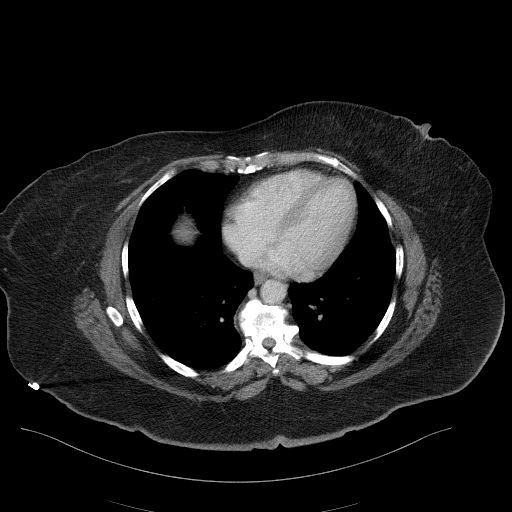

[Series 6: a/p w/ cor · coronal · 0.74mm/px · 3 of 155 slices shown]
[im 52/155  soft-tissue]
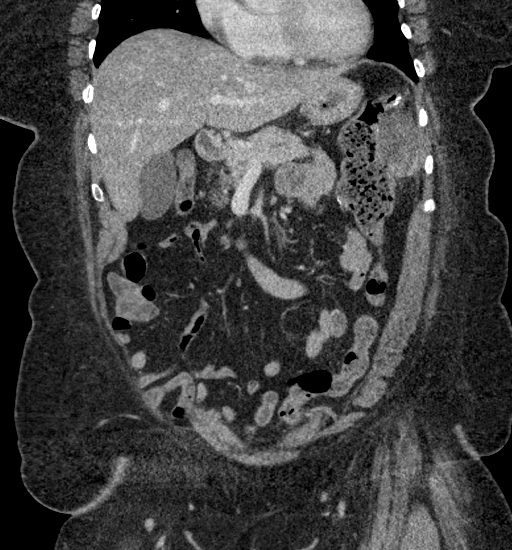
[im 69/155  soft-tissue]
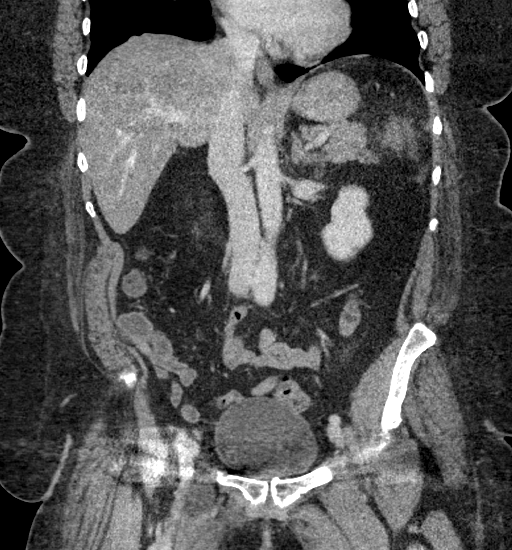
[im 86/155  soft-tissue]
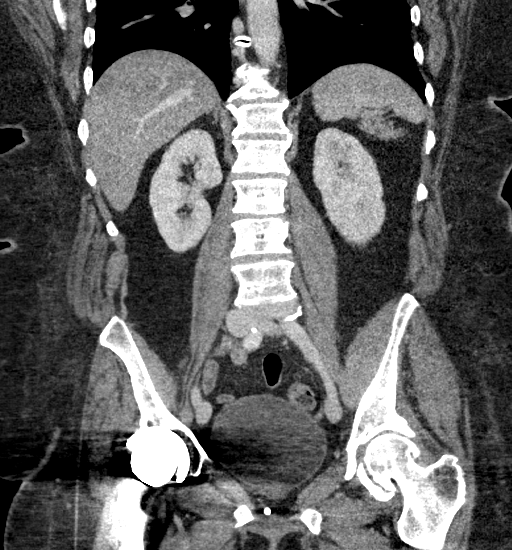

[15 of 46 positions shown; findings below may reference images not displayed]

FINDINGS: Lower chest: Clear lung bases. Normal heart size without pericardial
or pleural effusion. Upper normal cardiophrenic angle nodes,
including a left-sided 6 mm node on image 12/series 3. This is
enlarged since the prior.

Hepatobiliary: Moderate hepatic steatosis. Small gallstones without
acute cholecystitis or biliary duct dilatation.

Pancreas: Normal, without mass or ductal dilatation.

Spleen: Normal in size, without focal abnormality.

Adrenals/Urinary Tract: Normal adrenal glands. Normal kidneys,
without hydronephrosis. Degraded evaluation of the pelvis, secondary
to beam hardening artifact from right hip arthroplasty. Grossly
normal urinary bladder.

Stomach/Bowel: Normal stomach, without wall thickening. Surgical
changes in the region of the splenic flexure of the colon. Anterior
and lateral to surgical sutures is an approximately 4.3 by 3.4 cm
low-density mass. Example image 21/ series 3. This is slightly
greater than fluid density and has mild adjacent interstitial
thickening involving the left lateral Conal fascia. New since
01/21/2012.

Normal small bowel caliber.

Vascular/Lymphatic: Normal caliber of the aorta and branch vessels.
No abdominopelvic adenopathy.

Reproductive: Hysterectomy.  No adnexal mass.

Other: No significant free fluid. Mild pelvic floor laxity. Prior
ventral abdominal wall hernia repair. Residual or recurrent ventral
pelvic wall laxity, including image 45/ series 3, similar. Probable
scar tissue about the right paramidline abdominal wall including on
image 41/series 3, similar.

Musculoskeletal: Right hip arthroplasty. Left hip moderate
osteoarthritis. Degenerate sclerosis of the bilateral sacroiliac
joints. Lumbosacral spondylosis.
IMPRESSION: 1. Surgical sutures in the region of the splenic flexure of the
colon. Adjacent complex low-density "Mass" with adjacent lateral
Conal fascia wall thickening. This is new since 01/21/2012. If the
surgical changes are new since that exam, this could simply
represent postoperative hematoma or chronic abscess. If not,
considerations would include recurrent/metastatic disease or less
likely atypical appearance of complicated diverticulitis. Correlate
with interval surgery since 01/21/2012. Consider sampling of this
lesion and/or further evaluation with nonemergent outpatient PET.
2. Cholelithiasis.
3. Hepatic steatosis.
4. Degraded evaluation of the pelvis, secondary to beam hardening
artifact from right hip arthroplasty.
5. Borderline cardiophrenic angle adenopathy is likely related to
hepatic steatosis but nonspecific.

## 2019-03-13 IMAGING — DX DG CHEST 2V
2 series · 2 of 2 positions shown · non-contrast
Comparison: 09/09/2016

CLINICAL DATA: Left lower quadrant pain

EXAM:
CHEST  2 VIEW

[chest ap]
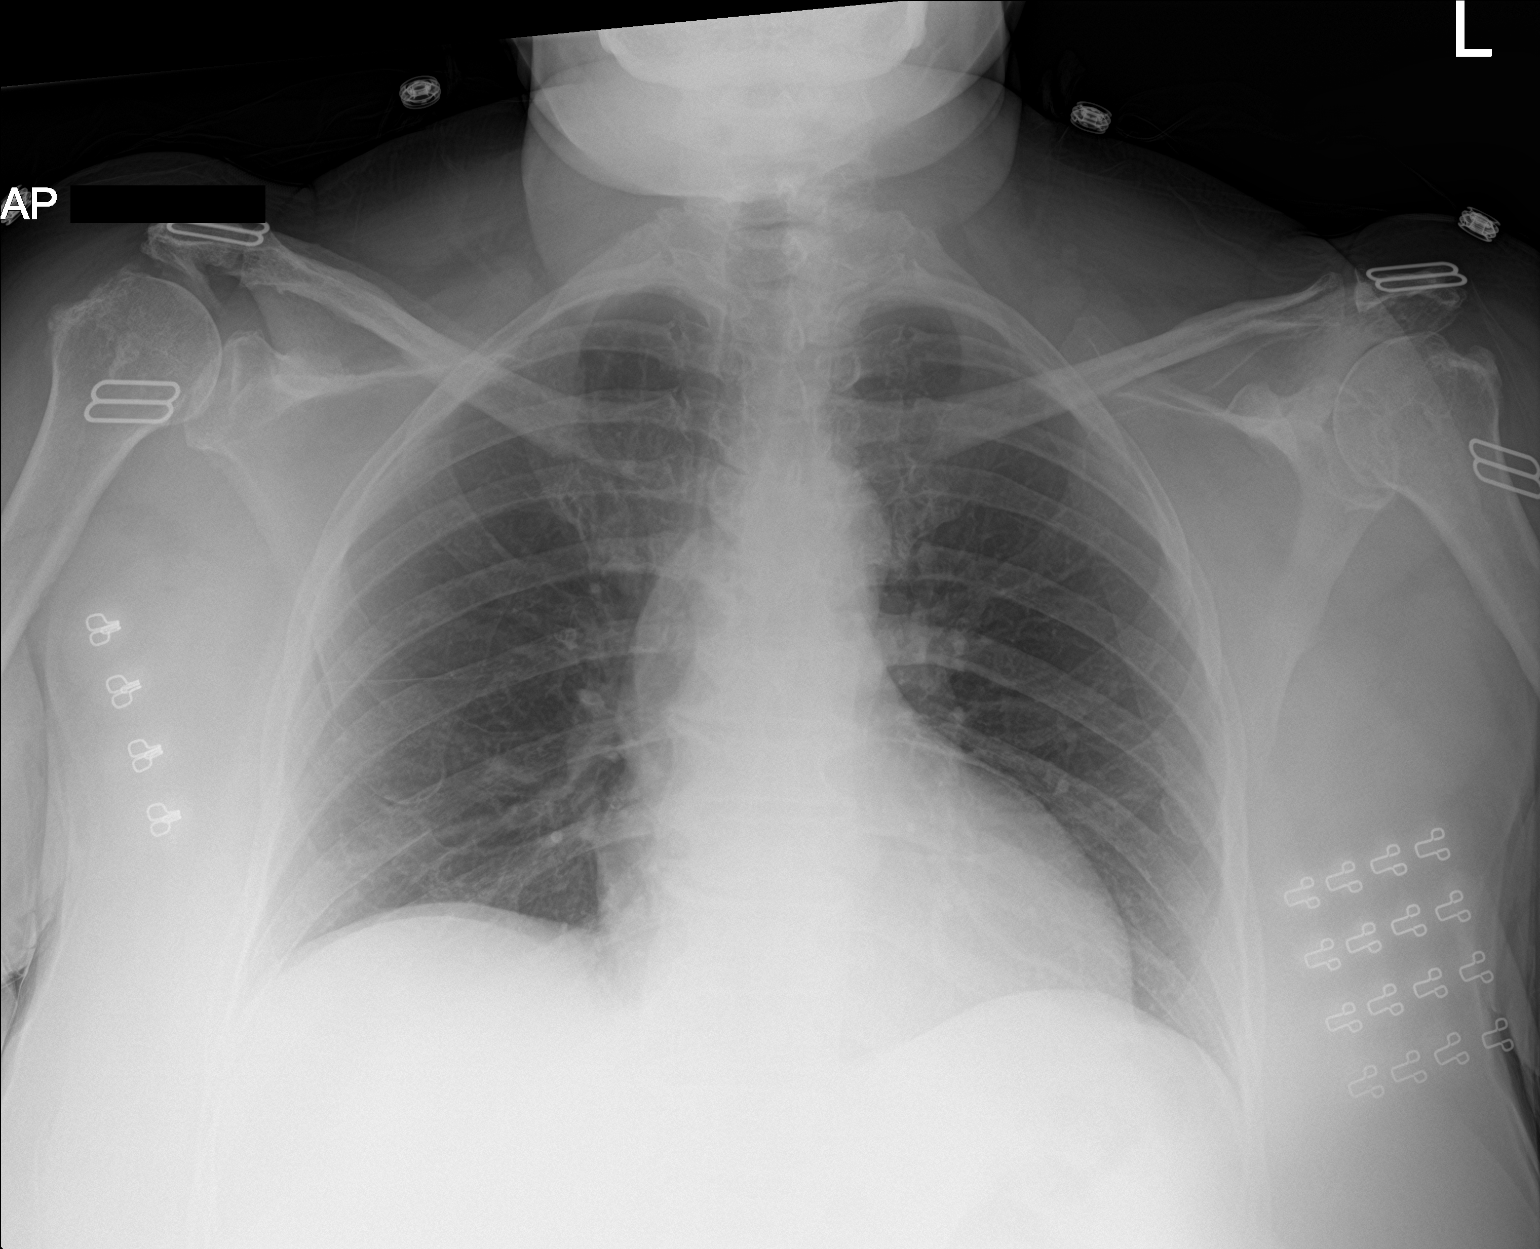

[chest lat]
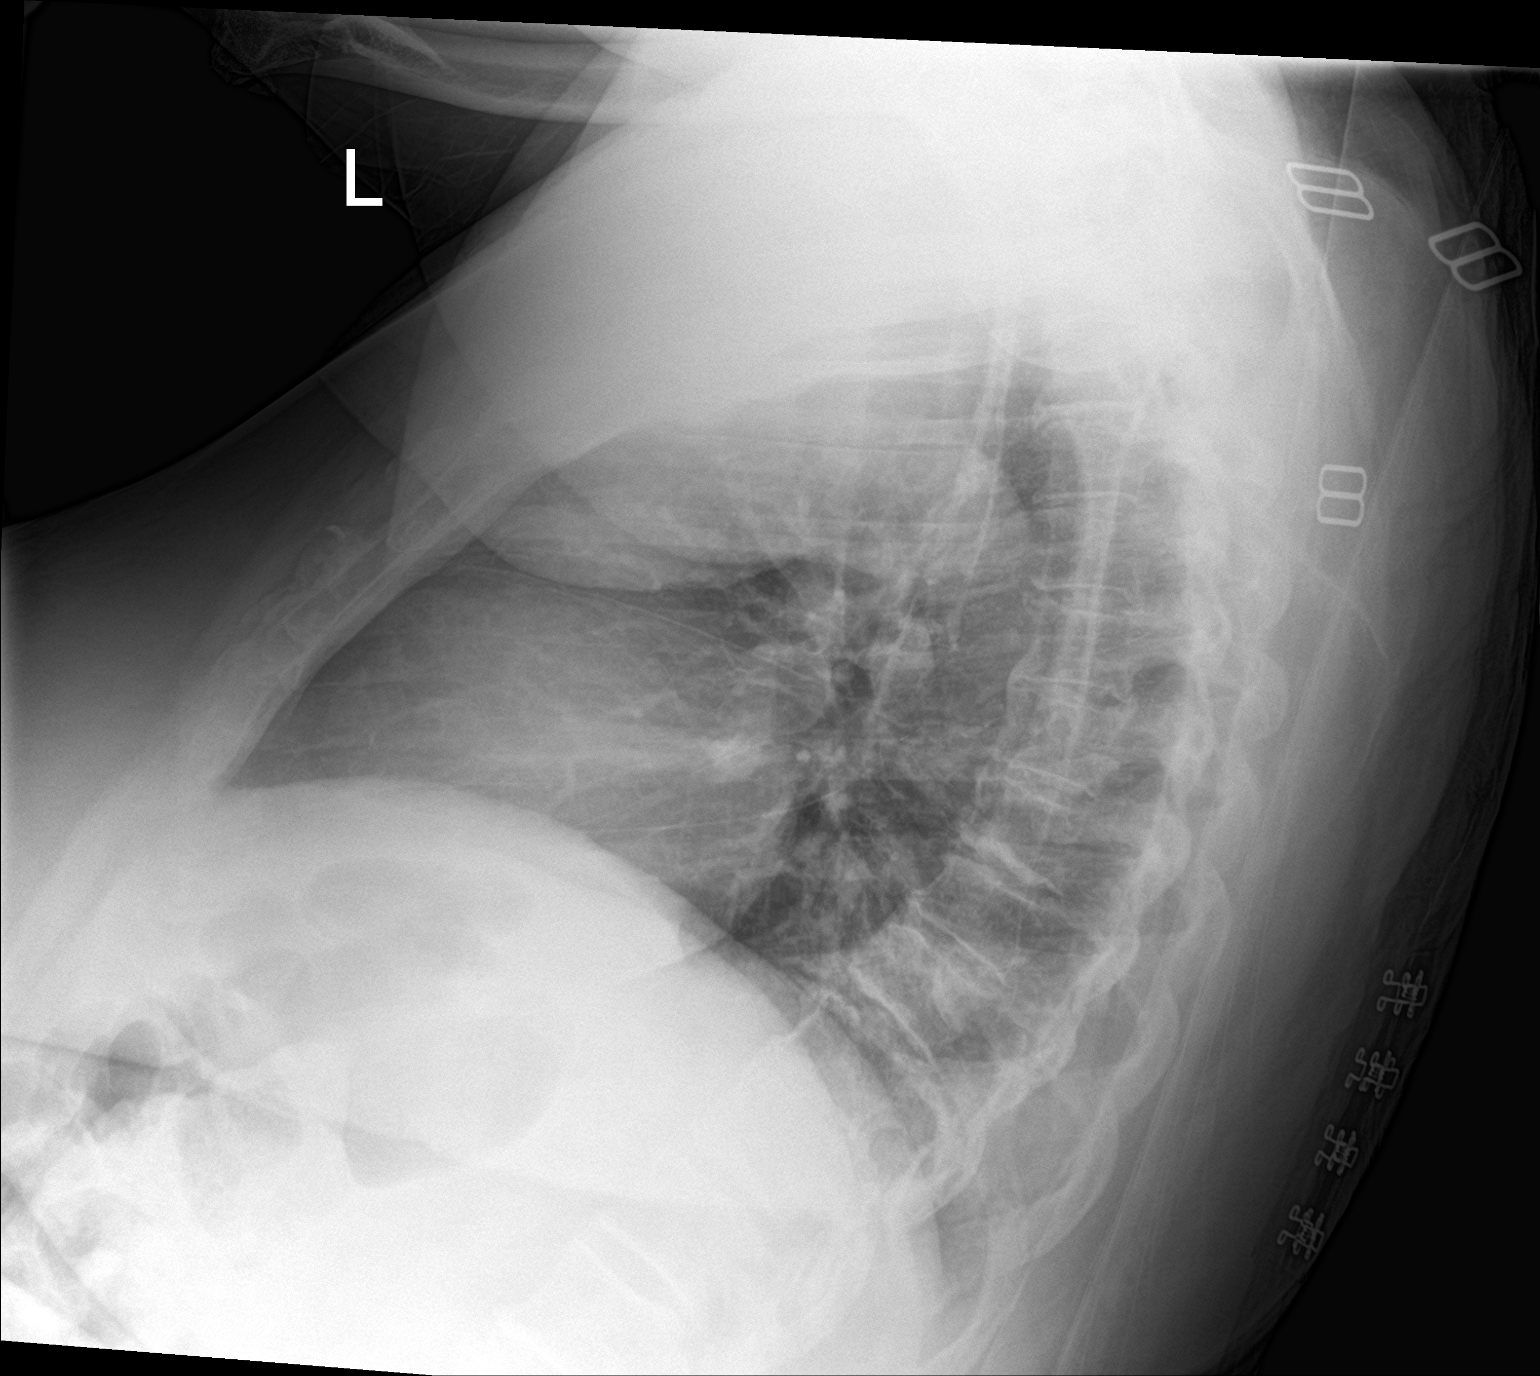

[2 of 2 positions shown; findings below may reference images not displayed]

FINDINGS: The heart size and mediastinal contours are within normal limits.
Both lungs are clear. Degenerative changes of the spine.
IMPRESSION: No active cardiopulmonary disease.

## 2019-03-28 ENCOUNTER — Ambulatory Visit (INDEPENDENT_AMBULATORY_CARE_PROVIDER_SITE_OTHER): Payer: Medicare Other | Admitting: Gynecology

## 2019-03-28 ENCOUNTER — Encounter: Payer: Self-pay | Admitting: Gynecology

## 2019-03-28 ENCOUNTER — Other Ambulatory Visit: Payer: Self-pay

## 2019-03-28 VITALS — BP 130/82 | Ht 63.0 in | Wt 226.0 lb

## 2019-03-28 DIAGNOSIS — Z01419 Encounter for gynecological examination (general) (routine) without abnormal findings: Secondary | ICD-10-CM

## 2019-03-28 DIAGNOSIS — N952 Postmenopausal atrophic vaginitis: Secondary | ICD-10-CM

## 2019-03-28 DIAGNOSIS — R21 Rash and other nonspecific skin eruption: Secondary | ICD-10-CM | POA: Diagnosis not present

## 2019-03-28 MED ORDER — FLUCONAZOLE 150 MG PO TABS: 150.0000 mg | ORAL_TABLET | Freq: Every day | ORAL | 0 refills | Status: DC

## 2019-03-28 MED ORDER — NYSTATIN 100000 UNIT/GM EX POWD: Freq: Two times a day (BID) | CUTANEOUS | 4 refills | Status: DC

## 2019-03-28 NOTE — Progress Notes (Signed)
    Angela Patton [birth year] [id number]        68 y.o.  G3P3 for annual gynecologic exam.  Continues to have issues with a skin rash in her panniculus.  Past medical history,surgical history, problem list, medications, allergies, family history and social history were all reviewed and documented as reviewed in the EPIC chart.  ROS:  Performed with pertinent positives and negatives included in the history, assessment and plan.   Additional significant findings : None   Exam: [name] assistant Vitals:   03/28/19 0813  BP: 130/82  Weight: 226 lb (102.5 kg)  Height: 5\' 3"  (1.6 m)   Body mass index is 40.03 kg/m.  General appearance:  Normal affect, orientation and appearance. Skin: Grossly normal excepting fungal rash in her panniculus and groin region bilaterally HEENT: Without gross lesions.  No cervical or supraclavicular adenopathy. Thyroid normal.  Lungs:  Clear without wheezing, rales or rhonchi Cardiac: RR, without RMG Abdominal:  Soft, nontender, without masses, guarding, rebound, organomegaly or hernia Breasts:  Examined lying and sitting without masses, retractions, discharge or axillary adenopathy. Pelvic:  Ext, BUS, Vagina: With atrophic changes  Adnexa: Without masses or tenderness    Anus and perineum: Normal   Rectovaginal: Normal sphincter tone without palpated masses or tenderness.    Assessment/Plan:  68 y.o. G3P3 female for annual gynecologic exam.  Status post hysterectomy in the past.  1. Postmenopausal.  No significant menopausal symptoms. 2. Classic fungal infection panniculus and groin.  Has been using nystatin powder and Mycolog.  Recommend Diflucan 150 mg daily x7 days and continue with nystatin powder twice daily.  Refill for the nystatin powder provided.  Follow-up if continues to be an issue. 3. Mammography 2018.  I reminded the patient she is overdue and she agrees to call and schedule.  Breast exam normal today. 4. Pap smear 2018.  No Pap  smear done today.  No history of abnormal Pap smears.  Options to stop screening per current screening guidelines reviewed.  Will readdress on an annual basis. 5. Colonoscopy 2018.  Repeat at their recommended interval. 6. DEXA 2018 normal.  Plan repeat DEXA at 5-year interval. 7. Health maintenance.  No routine lab work done as patient does this elsewhere.  Follow-up 1 year, sooner as needed.   Anastasio Auerbach MD, 8:35 AM 03/28/2019

## 2019-03-28 NOTE — Patient Instructions (Signed)
Take the yeast pill daily for 7 days.  Apply the powder twice daily to the rash area.  Follow-up if this continues to be an issue.  Follow-up in 1 year for annual exam

## 2019-04-11 ENCOUNTER — Telehealth: Payer: Self-pay | Admitting: *Deleted

## 2019-04-11 NOTE — Telephone Encounter (Signed)
Patient called and left message for a return call regarding diflucan tablets. I called and left message asking patient to call me.

## 2019-04-16 ENCOUNTER — Other Ambulatory Visit: Payer: Self-pay | Admitting: *Deleted

## 2019-04-23 ENCOUNTER — Other Ambulatory Visit: Payer: Self-pay

## 2019-07-18 ENCOUNTER — Other Ambulatory Visit: Payer: Self-pay

## 2019-07-20 ENCOUNTER — Other Ambulatory Visit: Payer: Self-pay

## 2019-07-20 ENCOUNTER — Ambulatory Visit (INDEPENDENT_AMBULATORY_CARE_PROVIDER_SITE_OTHER): Payer: Medicare HMO | Admitting: Obstetrics and Gynecology

## 2019-07-20 ENCOUNTER — Encounter: Payer: Self-pay | Admitting: Obstetrics and Gynecology

## 2019-07-20 VITALS — BP 130/84

## 2019-07-20 DIAGNOSIS — N644 Mastodynia: Secondary | ICD-10-CM | POA: Diagnosis not present

## 2019-07-20 NOTE — Progress Notes (Signed)
   Angela Patton  1950-12-19 [id number]  HPI The patient is a 69 y.o. G3P3 who presents today for bilateral breast pain.  She has noticed random onset of intermittent aching in both breasts in the last month or two.  Typically the pain only occurs in one side at a time, but it alternates.  She has not been doing any straining or new exercises/physical activity.  Angela Patton notices the discomfort at rest.  No spontaneous nipple discharge.  If she squeezes either of the breasts she can sometimes express some clearish to cloudy drainage from either nipple.  No bloody or malodorous discharge.  No skin changes on breast area, no peeling.  No fever or chills.  Achiness alternates around the breasts, no specific area.  No radiation to neck or collar bone. No relation to physical activity.  She does not take any hormones.  Last mammogram was 2018.    Past medical history,surgical history, problem list, medications, allergies, family history and social history were all reviewed and documented as reviewed in the EPIC chart.  No Family history of breast cancer, uterine cancer, ovarian cancer.  Mother had stomach cancer, brother had another type of cancer, another brother had Hogdkins lymphoma.    ROS:  Feeling well. No dyspnea or chest pain on exertion.  No abdominal pain, change in bowel habits, black or bloody stools.  No urinary tract symptoms. GYN ROS: no abnormal bleeding, pelvic pain or discharge, no breast lumps on self exam.  Physical Exam  BP 130/84   General: Pleasant female, no acute distress, alert and oriented  BREAST EXAM: Large pendulous breasts appear normal, no suspicious masses, no skin or nipple changes or axillary nodes, no skin dimpling or retraction.  Examined sitting and laying.  Able to express drainage from nipples.  Breasts are nontender on palpation.  Angela Patton present during the examination.   Assessment 69 yo G3P3 with intermittent bilateral mastalgia  Plan The patient is  reassured that her symptoms and lack of findings on exam today, along with the bilateral nature of the breast discomfort, do not strongly suggest malignancy as a likely cause for the mastalgia.  No evidence of mastitis on exam or by symptoms.  More likely is musculoskeletal discomfort due to pendulous breasts.  I do recommend that she continue with screening mammograms and she says she will plan to call to schedule one.  No need for diagnostic imaging at this point with no significant findings on exam today.  She says the pain does not really bother her that much and she just wanted to get it checked out.  If it is bothersome she could try ibuprofen or Tylenol, possibly topical NSAIDs, ice packs and heat as needed.  She should let us know if she notices any breast changes, change in nipple discharge and/or spontaneity, and/or worsening of the pain.   Joseph Pierini MD, FACOG 07/20/19

## 2019-07-20 NOTE — Patient Instructions (Signed)
Please remember to schedule your annual mammogram this year as soon as possible.

## 2020-03-28 ENCOUNTER — Encounter: Payer: Medicare (Managed Care) | Admitting: Obstetrics and Gynecology

## 2020-04-15 ENCOUNTER — Encounter: Payer: Self-pay | Admitting: Obstetrics and Gynecology

## 2020-04-15 ENCOUNTER — Ambulatory Visit (INDEPENDENT_AMBULATORY_CARE_PROVIDER_SITE_OTHER): Payer: Medicare (Managed Care) | Admitting: Obstetrics and Gynecology

## 2020-04-15 ENCOUNTER — Other Ambulatory Visit: Payer: Self-pay

## 2020-04-15 VITALS — BP 138/84 | Ht 63.0 in | Wt 225.0 lb

## 2020-04-15 DIAGNOSIS — Z01419 Encounter for gynecological examination (general) (routine) without abnormal findings: Secondary | ICD-10-CM

## 2020-04-15 DIAGNOSIS — Z1272 Encounter for screening for malignant neoplasm of vagina: Secondary | ICD-10-CM | POA: Diagnosis not present

## 2020-04-15 NOTE — Progress Notes (Signed)
   Angela Patton [birth year] [id number]  SUBJECTIVE:  69 y.o. G3P3 female here for a breast and pelvic exam and Pap smear. She has no gynecologic concerns.  Current Outpatient Medications  Medication Sig Dispense Refill  . albuterol (PROVENTIL HFA;VENTOLIN HFA) 108 (90 BASE) MCG/ACT inhaler Inhale 1-2 puffs into the lungs every 6 (six) hours as needed for wheezing or shortness of breath. 1 Inhaler 0  . nystatin (MYCOSTATIN/NYSTOP) powder Apply topically 2 (two) times daily. (Patient not taking: Reported on 04/15/2020) 60 g 4  . triamcinolone ointment (KENALOG) 0.1 % Apply 1 application topically 2 (two) times daily. (Patient not taking: Reported on 04/15/2020) 30 g 0   No current facility-administered medications for this visit.   Allergies: Penicillins  No LMP recorded. Patient has had a hysterectomy.  Past medical history,surgical history, problem list, medications, allergies, family history and social history were all reviewed and documented as reviewed in the EPIC chart.  GYN ROS: no abnormal bleeding, pelvic pain or discharge, no breast pain or new or enlarging lumps on self exam.  No dysuria, frequency, burning, pain with urination, cloudy/malodorous urine.   OBJECTIVE:  BP 138/84   Ht 5\' 3"  (1.6 m)   Wt 225 lb (102.1 kg)   BMI 39.86 kg/m  The patient appears well, alert, oriented, in no distress.  BREAST EXAM: breasts appear normal, no suspicious masses, no skin or nipple changes or axillary nodes  PELVIC EXAM: VULVA: normal appearing vulva with atrophic change, no masses, tenderness or lesions, VAGINA: normal appearing vagina with atrophic change, normal color and discharge, no lesions, CERVIX: surgically absent, UTERUS: surgically absent, vaginal cuff normal, ADNEXA: no masses, nontender, PAP: Pap smear done today, thin-prep method  Chaperone: [name] present during the examination  ASSESSMENT:  69 y.o. G3P3 here for a breast and pelvic exam  PLAN:   1.  Postmenopausal.  No significant menopausal symptoms. 2. Pap smear 2018.  No significant history of abnormal Pap smears.  We discussed screening guidelines and with age greater than 81, prior hysterectomy, normal Pap smear history, she would be a candidate to discontinue screening, but she is not comfortable with this and wants to continue.  We did go ahead and collect a Pap smear cytology only today. 3. Mammogram 03/2017.  Normal breast exam today.  I reminded the patient to get this scheduled soon as she is overdue and she acknowledges the recommendations and agrees to get this scheduled.   4. Colonoscopy 2018.  She will follow up at the interval recommended by her GI specialist.   5. DEXA 03/2017 normal.  Next DEXA recommended 2023 at 5 year interval.  6. Health maintenance.  No labs today as she normally has these completed elsewhere.  Blood pressure mildly elevated today and she will check in with her primary care doctor regarding follow-up evaluation.  Return annually or sooner, prn.  Joseph Pierini MD 04/15/20

## 2020-04-15 NOTE — Addendum Note (Signed)
Addended by: Nelva Nay on: 04/15/2020 10:36 AM   Modules accepted: Orders

## 2020-04-17 LAB — PAP IG W/ RFLX HPV ASCU

## 2020-08-22 ENCOUNTER — Telehealth: Payer: Self-pay | Admitting: *Deleted

## 2020-08-22 NOTE — Telephone Encounter (Signed)
(  formulary Dr.Kendall patient) Just Blanche East, NP [phone]) called from [facility] she did a home visit with patient on 08/21/20. She works with [employer] and does monthly home visit to check on patient. She reports the patient had a Peripheral Vascular disease screening done at this visit which showed mildly abnormal levels in left food .86 and right foot .72. Monica said patient had Dr.Kendall listed at PCP however she realized once she called we are a GYN office. I called Monica back and relayed I received her call and told her it appears does not have a PCP and I would call patient and discuss this with her. I called patient and explained we are not PCP office and she needs to get established with PCP. She denies any pain now, she asked me names of PCP office, which I provided her with [facility] she told she will call to get established.Marland Kitchen

## 2020-08-26 NOTE — Telephone Encounter (Signed)
Patient now schedule with Horald Pollen, MD on 10/14/20 as a new patient.

## 2020-10-14 ENCOUNTER — Other Ambulatory Visit: Payer: Self-pay

## 2020-10-14 ENCOUNTER — Ambulatory Visit (INDEPENDENT_AMBULATORY_CARE_PROVIDER_SITE_OTHER): Payer: Medicare Other | Admitting: Emergency Medicine

## 2020-10-14 ENCOUNTER — Encounter: Payer: Self-pay | Admitting: Emergency Medicine

## 2020-10-14 VITALS — BP 138/80 | HR 87 | Temp 98.2°F | Ht 63.0 in | Wt 230.0 lb

## 2020-10-14 DIAGNOSIS — Z85038 Personal history of other malignant neoplasm of large intestine: Secondary | ICD-10-CM

## 2020-10-14 DIAGNOSIS — E785 Hyperlipidemia, unspecified: Secondary | ICD-10-CM | POA: Diagnosis not present

## 2020-10-14 DIAGNOSIS — R030 Elevated blood-pressure reading, without diagnosis of hypertension: Secondary | ICD-10-CM | POA: Diagnosis not present

## 2020-10-14 DIAGNOSIS — Z7689 Persons encountering health services in other specified circumstances: Secondary | ICD-10-CM

## 2020-10-14 DIAGNOSIS — Z1231 Encounter for screening mammogram for malignant neoplasm of breast: Secondary | ICD-10-CM

## 2020-10-14 NOTE — Progress Notes (Signed)
Angela Patton 70 y.o.   Chief Complaint  Patient presents with   New Patient (Initial Visit)   history of colon cancer    HISTORY OF PRESENT ILLNESS: This is a 70 y.o. female here to establish care with me. Has no complaints or medical concerns today. Has history of colon cancer.  Doing well. History of dyslipidemia but on no medications. States her blood pressure always goes up when she comes to the office. Needs referral for mammogram.  HPI   Prior to Admission medications   Medication Sig Start Date End Date Taking? Authorizing Provider  albuterol (PROVENTIL HFA;VENTOLIN HFA) 108 (90 BASE) MCG/ACT inhaler Inhale 1-2 puffs into the lungs every 6 (six) hours as needed for wheezing or shortness of breath. 09/10/14  Yes Drenda Freeze, MD  nystatin (MYCOSTATIN/NYSTOP) powder Apply topically 2 (two) times daily. 03/28/19   Fontaine, Belinda Block, MD  triamcinolone ointment (KENALOG) 0.1 % Apply 1 application topically 2 (two) times daily. 03/17/18   Fontaine, Belinda Block, MD    Allergies  Allergen Reactions   Penicillins Hives and Swelling    Has patient had a PCN reaction causing immediate rash, facial/tongue/throat swelling, SOB or lightheadedness with hypotension:  Has patient had a PCN reaction causing severe rash involving mucus membranes or skin necrosis:  Has patient had a PCN reaction that required hospitalization:  Has patient had a PCN reaction occurring within the last 10 years:  If all of the above answers are "NO", then may proceed with Cephalosporin use.     Patient Active Problem List   Diagnosis Date Noted   History of colon cancer 02/09/2017   Hypertension    Coronary artery disease    Cancer ([place])    Hyperlipidemia     Past Medical History:  Diagnosis Date   Anemia    Arthritis    Asthma    Cancer ([place])    colon    Chest pain    Hospital 09/2010,  /   Stress echo October 29, 2010, vigorous LV function with stress.  All walls could not be assessed  fully but it was felt that there was no ischemia.   Coronary artery disease    MI per patient 2007 elsewhere, no records, pt. says cath  was [facility]   Diverticulitis    Elevated CPK    Hospital 09/2010,  troponin normal   Hyperlipidemia    Hypertension    Kidney stone    MI (myocardial infarction) ([place])    2007    Past Surgical History:  Procedure Laterality Date   ABDOMINAL HYSTERECTOMY     CESAREAN SECTION     x 2   COLON SURGERY     2004   FOOT SURGERY     left 1999   HERNIA REPAIR     three total, 2007   SHOULDER SURGERY     left shoulder 2008   TOTAL HIP ARTHROPLASTY     right hip 2007    Social History   Socioeconomic History   Marital status: Married    Spouse name: Not on file   Number of children: Not on file   Years of education: Not on file   Highest education level: Not on file  Occupational History   Not on file  Tobacco Use   Smoking status: Never   Smokeless tobacco: Never  Vaping Use   Vaping Use: Never used  Substance and Sexual Activity   Alcohol use: Yes    Comment:  Rare   Drug use: No   Sexual activity: Never    Comment: 1st intercourse 70 yo-Fewer than 5 partners  Other Topics Concern   Not on file  Social History Narrative   Not on file   Social Determinants of Health   Financial Resource Strain: Not on file  Food Insecurity: Not on file  Transportation Needs: Not on file  Physical Activity: Not on file  Stress: Not on file  Social Connections: Not on file  Intimate Partner Violence: Not on file    Family History  Problem Relation Age of Onset   Bone cancer Mother    Colon cancer Father    Diabetes Sister    Hodgkin's lymphoma Brother    Stomach cancer Brother    Diabetes Sister    Diabetes Sister      Review of Systems  Constitutional: Negative.  Negative for chills and fever.  HENT: Negative.  Negative for congestion and sore throat.   Respiratory: Negative.  Negative for cough and shortness of breath.    Cardiovascular: Negative.  Negative for chest pain and palpitations.  Gastrointestinal:  Negative for abdominal pain, diarrhea, nausea and vomiting.  Genitourinary: Negative.  Negative for dysuria and hematuria.  Musculoskeletal: Negative.   Skin: Negative.  Negative for rash.  Neurological:  Negative for dizziness and headaches.  All other systems reviewed and are negative.  Today's Vitals   10/14/20 1103  BP: (!) 160/90  Pulse: 87  Temp: 98.2 F (36.8 C)  TempSrc: Oral  SpO2: 98%  Weight: 230 lb (104.3 kg)  Height: _0  (1.6 m)   Body mass index is 40.74 kg/m.  Physical Exam Vitals reviewed.  Constitutional:      Appearance: Normal appearance. She is obese.  HENT:     Head: Normocephalic.  Eyes:     Extraocular Movements: Extraocular movements intact.     Pupils: Pupils are equal, round, and reactive to light.  Cardiovascular:     Rate and Rhythm: Normal rate and regular rhythm.     Pulses: Normal pulses.     Heart sounds: Normal heart sounds.  Pulmonary:     Effort: Pulmonary effort is normal.     Breath sounds: Normal breath sounds.  Musculoskeletal:     Cervical back: Normal range of motion and neck supple.  Skin:    General: Skin is warm and dry.  Neurological:     General: No focal deficit present.     Mental Status: She is alert and oriented to person, place, and time.  Psychiatric:        Mood and Affect: Mood normal.        Behavior: Behavior normal.     ASSESSMENT & PLAN: Clinically stable.  No medical concerns identified during this visit. A total of 20 minutes was spent with the patient and counseling/coordination of care regarding establishing care with me, review of past medical history, review of all medications, review of most recent office visit notes, education on nutrition, documentation and need for follow-up.   Nada was seen today for new patient (initial visit) and history of colon cancer.  Diagnoses and all orders for this  visit:  Dyslipidemia  Encounter for screening mammogram for malignant neoplasm of breast -     MM Digital Screening; Future  Encounter to establish care  History of colon cancer  White coat syndrome without diagnosis of hypertension  Patient Instructions  Preventive Care 47 Years and Older, Female Preventive care refers to lifestyle  choices and visits with your health care provider that can promote health and wellness. This includes: A yearly physical exam. This is also called an annual wellness visit. Regular dental and eye exams. Immunizations. Screening for certain conditions. Healthy lifestyle choices, such as: Eating a healthy diet. Getting regular exercise. Not using drugs or products that contain nicotine and tobacco. Limiting alcohol use. What can I expect for my preventive care visit? Physical exam Your health care provider will check your: Height and weight. These may be used to calculate your BMI (body mass index). BMI is a measurement that tells if you are at a healthy weight. Heart rate and blood pressure. Body temperature. Skin for abnormal spots. Counseling Your health care provider may ask you questions about your: Past medical problems. Family's medical history. Alcohol, tobacco, and drug use. Emotional well-being. Home life and relationship well-being. Sexual activity. Diet, exercise, and sleep habits. History of falls. Memory and ability to understand (cognition). Work and work Statistician. Pregnancy and menstrual history. Access to firearms. What immunizations do I need?  Vaccines are usually given at various ages, according to a schedule. Your health care provider will recommend vaccines for you based on your age, medicalhistory, and lifestyle or other factors, such as travel or where you work. What tests do I need? Blood tests Lipid and cholesterol levels. These may be checked every 5 years, or more often depending on your overall  health. Hepatitis C test. Hepatitis B test. Screening Lung cancer screening. You may have this screening every year starting at age 65 if you have a 30-pack-year history of smoking and currently smoke or have quit within the past 15 years. Colorectal cancer screening. All adults should have this screening starting at age 75 and continuing until age 41. Your health care provider may recommend screening at age 61 if you are at increased risk. You will have tests every 1-10 years, depending on your results and the type of screening test. Diabetes screening. This is done by checking your blood sugar (glucose) after you have not eaten for a while (fasting). You may have this done every 1-3 years. Mammogram. This may be done every 1-2 years. Talk with your health care provider about how often you should have regular mammograms. Abdominal aortic aneurysm (AAA) screening. You may need this if you are a current or former smoker. BRCA-related cancer screening. This may be done if you have a family history of breast, ovarian, tubal, or peritoneal cancers. Other tests STD (sexually transmitted disease) testing, if you are at risk. Bone density scan. This is done to screen for osteoporosis. You may have this done starting at age 77. Talk with your health care provider about your test results, treatment options,and if necessary, the need for more tests. Follow these instructions at home: Eating and drinking  Eat a diet that includes fresh fruits and vegetables, whole grains, lean protein, and low-fat dairy products. Limit your intake of foods with high amounts of sugar, saturated fats, and salt. Take vitamin and mineral supplements as recommended by your health care provider. Do not drink alcohol if your health care provider tells you not to drink. If you drink alcohol: Limit how much you have to 0-1 drink a day. Be aware of how much alcohol is in your drink. In the [place] one drink equals one 12 oz  bottle of beer (355 mL), one 5 oz glass of wine (148 mL), or one 1 oz glass of hard liquor (44 mL).  Lifestyle Take daily  care of your teeth and gums. Brush your teeth every morning and night with fluoride toothpaste. Floss one time each day. Stay active. Exercise for at least 30 minutes 5 or more days each week. Do not use any products that contain nicotine or tobacco, such as cigarettes, e-cigarettes, and chewing tobacco. If you need help quitting, ask your health care provider. Do not use drugs. If you are sexually active, practice safe sex. Use a condom or other form of protection in order to prevent STIs (sexually transmitted infections). Talk with your health care provider about taking a low-dose aspirin or statin. Find healthy ways to cope with stress, such as: Meditation, yoga, or listening to music. Journaling. Talking to a trusted person. Spending time with friends and family. Safety Always wear your seat belt while driving or riding in a vehicle. Do not drive: If you have been drinking alcohol. Do not ride with someone who has been drinking. When you are tired or distracted. While texting. Wear a helmet and other protective equipment during sports activities. If you have firearms in your house, make sure you follow all gun safety procedures. What's next? Visit your health care provider once a year for an annual wellness visit. Ask your health care provider how often you should have your eyes and teeth checked. Stay up to date on all vaccines. This information is not intended to replace advice given to you by your health care provider. Make sure you discuss any questions you have with your healthcare provider. Document Revised: 04/02/2020 Document Reviewed: 04/06/2018 Elsevier Patient Education  2022 [place], MD [facility]

## 2020-10-14 NOTE — Patient Instructions (Signed)
Preventive Care 71 Years and Older, Female Preventive care refers to lifestyle choices and visits with your health care provider that can promote health and wellness. This includes: A yearly physical exam. This is also called an annual wellness visit. Regular dental and eye exams. Immunizations. Screening for certain conditions. Healthy lifestyle choices, such as: Eating a healthy diet. Getting regular exercise. Not using drugs or products that contain nicotine and tobacco. Limiting alcohol use. What can I expect for my preventive care visit? Physical exam Your health care provider will check your: Height and weight. These may be used to calculate your BMI (body mass index). BMI is a measurement that tells if you are at a healthy weight. Heart rate and blood pressure. Body temperature. Skin for abnormal spots. Counseling Your health care provider may ask you questions about your: Past medical problems. Family's medical history. Alcohol, tobacco, and drug use. Emotional well-being. Home life and relationship well-being. Sexual activity. Diet, exercise, and sleep habits. History of falls. Memory and ability to understand (cognition). Work and work Statistician. Pregnancy and menstrual history. Access to firearms. What immunizations do I need?  Vaccines are usually given at various ages, according to a schedule. Your health care provider will recommend vaccines for you based on your age, medicalhistory, and lifestyle or other factors, such as travel or where you work. What tests do I need? Blood tests Lipid and cholesterol levels. These may be checked every 5 years, or more often depending on your overall health. Hepatitis C test. Hepatitis B test. Screening Lung cancer screening. You may have this screening every year starting at age 44 if you have a 30-pack-year history of smoking and currently smoke or have quit within the past 15 years. Colorectal cancer screening. All  adults should have this screening starting at age 39 and continuing until age 65. Your health care provider may recommend screening at age 61 if you are at increased risk. You will have tests every 1-10 years, depending on your results and the type of screening test. Diabetes screening. This is done by checking your blood sugar (glucose) after you have not eaten for a while (fasting). You may have this done every 1-3 years. Mammogram. This may be done every 1-2 years. Talk with your health care provider about how often you should have regular mammograms. Abdominal aortic aneurysm (AAA) screening. You may need this if you are a current or former smoker. BRCA-related cancer screening. This may be done if you have a family history of breast, ovarian, tubal, or peritoneal cancers. Other tests STD (sexually transmitted disease) testing, if you are at risk. Bone density scan. This is done to screen for osteoporosis. You may have this done starting at age 54. Talk with your health care provider about your test results, treatment options,and if necessary, the need for more tests. Follow these instructions at home: Eating and drinking  Eat a diet that includes fresh fruits and vegetables, whole grains, lean protein, and low-fat dairy products. Limit your intake of foods with high amounts of sugar, saturated fats, and salt. Take vitamin and mineral supplements as recommended by your health care provider. Do not drink alcohol if your health care provider tells you not to drink. If you drink alcohol: Limit how much you have to 0-1 drink a day. Be aware of how much alcohol is in your drink. In the [place] one drink equals one 12 oz bottle of beer (355 mL), one 5 oz glass of wine (148 mL), or one 1  oz glass of hard liquor (44 mL).  Lifestyle Take daily care of your teeth and gums. Brush your teeth every morning and night with fluoride toothpaste. Floss one time each day. Stay active. Exercise for at  least 30 minutes 5 or more days each week. Do not use any products that contain nicotine or tobacco, such as cigarettes, e-cigarettes, and chewing tobacco. If you need help quitting, ask your health care provider. Do not use drugs. If you are sexually active, practice safe sex. Use a condom or other form of protection in order to prevent STIs (sexually transmitted infections). Talk with your health care provider about taking a low-dose aspirin or statin. Find healthy ways to cope with stress, such as: Meditation, yoga, or listening to music. Journaling. Talking to a trusted person. Spending time with friends and family. Safety Always wear your seat belt while driving or riding in a vehicle. Do not drive: If you have been drinking alcohol. Do not ride with someone who has been drinking. When you are tired or distracted. While texting. Wear a helmet and other protective equipment during sports activities. If you have firearms in your house, make sure you follow all gun safety procedures. What's next? Visit your health care provider once a year for an annual wellness visit. Ask your health care provider how often you should have your eyes and teeth checked. Stay up to date on all vaccines. This information is not intended to replace advice given to you by your health care provider. Make sure you discuss any questions you have with your healthcare provider. Document Revised: 04/02/2020 Document Reviewed: 04/06/2018 Elsevier Patient Education  2022 [employer].

## 2020-10-20 ENCOUNTER — Other Ambulatory Visit: Payer: Self-pay

## 2020-10-20 ENCOUNTER — Ambulatory Visit
Admission: RE | Admit: 2020-10-20 | Discharge: 2020-10-20 | Disposition: A | Discharge status: Home / Self Care | Payer: Medicare Other | Source: Ambulatory Visit | Attending: Emergency Medicine | Admitting: Emergency Medicine

## 2020-10-20 DIAGNOSIS — Z1231 Encounter for screening mammogram for malignant neoplasm of breast: Secondary | ICD-10-CM

## 2020-10-21 ENCOUNTER — Ambulatory Visit: Payer: Medicare Other

## 2020-10-31 ENCOUNTER — Telehealth: Payer: Self-pay | Admitting: Internal Medicine

## 2020-10-31 NOTE — Telephone Encounter (Signed)
  Patient is requesting a call back in regards to her results from her recent mammogram. Please advise

## 2020-11-03 NOTE — Telephone Encounter (Signed)
Call patient please.  Normal mammogram.  Thanks.

## 2020-11-03 NOTE — Telephone Encounter (Signed)
Called and left vm with results.

## 2020-12-05 DIAGNOSIS — H43393 Other vitreous opacities, bilateral: Secondary | ICD-10-CM | POA: Diagnosis not present

## 2021-01-16 ENCOUNTER — Telehealth: Payer: Self-pay | Admitting: Emergency Medicine

## 2021-01-16 NOTE — Telephone Encounter (Signed)
Type of form received: handicapped placard  Form placed in: Provider mailbox  Additional instructions from the patient :notify by phone when complete   Things to remember: [place] office: If form received in person, remind patient that forms take 7-10 business days CMA should attach charge sheet and put on Supervisor's desk

## 2021-01-21 NOTE — Telephone Encounter (Signed)
Called and left vm to return call to discuss disability placard.

## 2021-01-22 NOTE — Telephone Encounter (Signed)
Form completed.  Thanks.

## 2021-01-22 NOTE — Telephone Encounter (Signed)
Called and spoke with pt on why she need the disability parking placard. Patient states she had hip surgery a few years ago and it is difficult for her to walk long distances. Patients also states that she uses a walker with a seat to get around. Placed form in box for signature.

## 2021-01-26 NOTE — Telephone Encounter (Signed)
Called and spoke with pt to pick up her form for disability placard.

## 2021-03-16 ENCOUNTER — Ambulatory Visit (INDEPENDENT_AMBULATORY_CARE_PROVIDER_SITE_OTHER): Payer: Medicare Other | Admitting: Nurse Practitioner

## 2021-03-16 ENCOUNTER — Encounter: Payer: Self-pay | Admitting: Nurse Practitioner

## 2021-03-16 ENCOUNTER — Other Ambulatory Visit: Payer: Self-pay

## 2021-03-16 VITALS — BP 124/80

## 2021-03-16 DIAGNOSIS — N644 Mastodynia: Secondary | ICD-10-CM | POA: Diagnosis not present

## 2021-03-16 DIAGNOSIS — N6452 Nipple discharge: Secondary | ICD-10-CM

## 2021-03-16 NOTE — Progress Notes (Signed)
   Acute Office Visit  Subjective:    Patient ID: Angela Patton, female    DOB: [birth year], 70 y.o.   MRN: [id number]   HPI 70 y.o. presents today for bilateral breast pain. This is not new for her and she has been evaluated for this in the past with negative workup. The pain is intermittent, described as sore, mild, and she has bilateral clear nipple discharge with stimulation. She thinks she has had the discharge for a few years. Normal mammogram history with most recent 09/2020 with ACR breast density category b.    Review of Systems  Constitutional: Negative.   Right breast: Positive for pain and nipple discharge. Negative for mass, swelling, nipple inversion, or redness Left breast: Positive for pain and nipple discharge. Negative for mass, swelling, nipple inversion, or redness    Objective:    Physical Exam Constitutional:      Appearance: Normal appearance.  Chest:  Breasts:    Right: Nipple discharge (clear) and tenderness (Generalized) present. No swelling, bleeding, inverted nipple, mass or skin change.     Left: Nipple discharge (clear) and tenderness (Generalized) present. No swelling, bleeding, inverted nipple, mass or skin change.    BP 124/80  Wt Readings from Last 3 Encounters:  10/14/20 230 lb (104.3 kg)  04/15/20 225 lb (102.1 kg)  03/28/19 226 lb (102.5 kg)        Assessment & Plan:   Problem List Items Addressed This Visit   None Visit Diagnoses     Bilateral mastodynia    -  Primary   Nipple discharge       Relevant Orders   Prolactin      Plan: Reassurance provided on normal breast exam today other than clear nipple discharge with stimulation. Will check prolactin today as it does not seem to have ever been checked. Reassurance also provided on nature of pain and bilateral discomfort as not being suggestive of malignancy. Pain likely from pendulous breasts. Recommend cold or warm compresses, tylenol/ibuprofen for pain as needed, and supportive  bras without wire. She reports the pain is very tolerable. She is agreeable to plan.      Tamela Gammon DNP, 2:29 PM 03/16/2021

## 2021-03-17 LAB — PROLACTIN: Prolactin: 6.8 ng/mL

## 2021-04-21 ENCOUNTER — Ambulatory Visit: Payer: Medicare Other | Admitting: Podiatry

## 2021-04-21 ENCOUNTER — Other Ambulatory Visit: Payer: Self-pay

## 2021-04-21 DIAGNOSIS — B351 Tinea unguium: Secondary | ICD-10-CM

## 2021-04-21 DIAGNOSIS — M79675 Pain in left toe(s): Secondary | ICD-10-CM | POA: Diagnosis not present

## 2021-04-21 DIAGNOSIS — R0989 Other specified symptoms and signs involving the circulatory and respiratory systems: Secondary | ICD-10-CM | POA: Diagnosis not present

## 2021-04-21 DIAGNOSIS — L600 Ingrowing nail: Secondary | ICD-10-CM | POA: Diagnosis not present

## 2021-04-21 DIAGNOSIS — M79674 Pain in right toe(s): Secondary | ICD-10-CM | POA: Diagnosis not present

## 2021-04-24 NOTE — Progress Notes (Signed)
Subjective:   Patient ID: Angela Patton, female   DOB: 70 y.o.   MRN: [id number]   HPI 70 year old female presents the office today for concerns of both of her big toenails becoming ingrown causing discomfort.  Right side is worse than the left.  There is any drainage or swelling or any redness.  The nails in general hypertrophic, dystrophic, elongated causing discomfort.  Denies any open sores.  She has no other concerns today.   Review of Systems  All other systems reviewed and are negative.  Past Medical History:  Diagnosis Date   Anemia    Arthritis    Asthma    Cancer ([place])    colon    Chest pain    Hospital 09/2010,  /   Stress echo October 29, 2010, vigorous LV function with stress.  All walls could not be assessed fully but it was felt that there was no ischemia.   Coronary artery disease    MI per patient 2007 elsewhere, no records, pt. says cath  was [facility]   Diverticulitis    Elevated CPK    Hospital 09/2010,  troponin normal   Hyperlipidemia    Hypertension    Kidney stone    MI (myocardial infarction) ([place])    2007    Past Surgical History:  Procedure Laterality Date   ABDOMINAL HYSTERECTOMY     CESAREAN SECTION     x 2   COLON SURGERY     2004   FOOT SURGERY     left 1999   HERNIA REPAIR     three total, 2007   SHOULDER SURGERY     left shoulder 2008   TOTAL HIP ARTHROPLASTY     right hip 2007     Current Outpatient Medications:    albuterol (PROVENTIL HFA;VENTOLIN HFA) 108 (90 BASE) MCG/ACT inhaler, Inhale 1-2 puffs into the lungs every 6 (six) hours as needed for wheezing or shortness of breath., Disp: 1 Inhaler, Rfl: 0   nystatin (MYCOSTATIN/NYSTOP) powder, Apply topically 2 (two) times daily. (Patient not taking: Reported on 03/16/2021), Disp: 60 g, Rfl: 4   triamcinolone ointment (KENALOG) 0.1 %, Apply 1 application topically 2 (two) times daily., Disp: 30 g, Rfl: 0  Allergies  Allergen Reactions   Penicillins Hives and Swelling    Has patient had  a PCN reaction causing immediate rash, facial/tongue/throat swelling, SOB or lightheadedness with hypotension:  Has patient had a PCN reaction causing severe rash involving mucus membranes or skin necrosis:  Has patient had a PCN reaction that required hospitalization:  Has patient had a PCN reaction occurring within the last 10 years:  If all of the above answers are "NO", then may proceed with Cephalosporin use.          Objective:  Physical Exam  General: AAO x3, NAD  Dermatological: Nails appear to be hypertrophic, dystrophic with yellow-brown discoloration with subungual debris.  Incurvation present all the hallux toenails without any edema, erythema or signs of infection at this time.  No drainage or pus noted.  No open lesions.  Vascular: DP pulses 2/4, PT pulses 1/4 bilaterally.  CRT less than 3 seconds. There is no pain with calf compression, swelling, warmth, erythema.   Neruologic: Grossly intact via light touch bilateral.   Musculoskeletal: Tenderness to the toenail sites but no other areas of discomfort.  Muscular strength 5/5 in all groups tested bilateral.     Assessment:   Ingrown toenails, symptomatic onychomycosis  Plan:  -Treatment options discussed including all alternatives, risks, and complications -Etiology of symptoms were discussed -We discussed conservative treatment versus partial nail avulsions.  I did an ABI in the office today to make sure adequate healing.  The left was 1.08 right was 1.06.  After discussion she was offered partial nail avulsions.  I did sharply debride the nails with any complications or bleeding x10.  Trula Slade DPM

## 2021-05-31 NOTE — Progress Notes (Signed)
Subjective:    Patient ID: Angela Patton, female    DOB: Jun 15, 1950, 71 y.o.   MRN: [id number]  This visit occurred during the SARS-CoV-2 public health emergency.  Safety protocols were in place, including screening questions prior to the visit, additional usage of staff PPE, and extensive cleaning of exam room while observing appropriate contact time as indicated for disinfecting solutions.    HPI The patient is here for an acute visit.   Pain in lower back -  the pain is across her lower back - it started about one week ago maybe longer ago.  She had a cold a while ago and it hurt when she was coughing -the pain is intermittent.  The pain is worse with getting up and getting down. She has no radiation or N/T in legs.  No muscle spasms in the lower back.  Advil has helped, but she only took it once or twice-she does not like to take medication if she can avoid it.  Heat helps.   Cough -she has been experiencing a cough and is having difficulty getting up the sputum, but when she does get it up it is yellow in color.  She has a history of asthma and has had occasional wheezing.  She uses her inhaler as needed and it does help.  She denies chest tightness, shortness of breath, fevers or other cold symptoms.  She was concerned whether her cough and pain were related-?  Pneumonia.   Medications and allergies reviewed with patient and updated if appropriate.  Patient Active Problem List   Diagnosis Date Noted   History of colon cancer 02/09/2017   Hypertension    Coronary artery disease    Cancer (HCC)    Hyperlipidemia     Current Outpatient Medications on File Prior to Visit  Medication Sig Dispense Refill   albuterol (PROVENTIL HFA;VENTOLIN HFA) 108 (90 BASE) MCG/ACT inhaler Inhale 1-2 puffs into the lungs every 6 (six) hours as needed for wheezing or shortness of breath. 1 Inhaler 0   Ibuprofen (ADVIL) 200 MG CAPS Take by mouth.     No current facility-administered medications  on file prior to visit.    Past Medical History:  Diagnosis Date   Anemia    Arthritis    Asthma    Cancer ([place])    colon    Chest pain    Hospital 09/2010,  /   Stress echo October 29, 2010, vigorous LV function with stress.  All walls could not be assessed fully but it was felt that there was no ischemia.   Coronary artery disease    MI per patient 2007 elsewhere, no records, pt. says cath  was [facility]   Diverticulitis    Elevated CPK    Hospital 09/2010,  troponin normal   Hyperlipidemia    Hypertension    Kidney stone    MI (myocardial infarction) ([place])    2007    Past Surgical History:  Procedure Laterality Date   ABDOMINAL HYSTERECTOMY     CESAREAN SECTION     x 2   COLON SURGERY     2004   FOOT SURGERY     left 1999   HERNIA REPAIR     three total, 2007   SHOULDER SURGERY     left shoulder 2008   TOTAL HIP ARTHROPLASTY     right hip 2007    Social History   Socioeconomic History   Marital status: Married  Spouse name: Not on file   Number of children: Not on file   Years of education: Not on file   Highest education level: Not on file  Occupational History   Not on file  Tobacco Use   Smoking status: Never   Smokeless tobacco: Never  Vaping Use   Vaping Use: Never used  Substance and Sexual Activity   Alcohol use: Yes    Comment: Rare   Drug use: No   Sexual activity: Never    Comment: 1st intercourse 71 yo-Fewer than 5 partners  Other Topics Concern   Not on file  Social History Narrative   Not on file   Social Determinants of Health   Financial Resource Strain: Not on file  Food Insecurity: Not on file  Transportation Needs: Not on file  Physical Activity: Not on file  Stress: Not on file  Social Connections: Not on file    Family History  Problem Relation Age of Onset   Bone cancer Mother    Colon cancer Father    Diabetes Sister    Hodgkin's lymphoma Brother    Stomach cancer Brother    Diabetes Sister    Diabetes Sister      Review of Systems  Constitutional:  Negative for chills and fever.  HENT:  Negative for congestion, ear pain, postnasal drip and sore throat.   Respiratory:  Positive for cough (yellow sputum) and wheezing (occ). Negative for chest tightness and shortness of breath.   Cardiovascular:  Negative for chest pain.  Gastrointestinal:  Negative for abdominal pain, constipation, diarrhea and nausea.       No gerd  Genitourinary:  Negative for dysuria and hematuria.  Musculoskeletal:  Positive for back pain. Negative for myalgias.  Skin:  Negative for rash.  Neurological:  Negative for numbness.      Objective:   Vitals:   06/03/21 1006  BP: 132/84  Pulse: 70  Temp: 98 F (36.7 C)  SpO2: 98%   BP Readings from Last 3 Encounters:  06/03/21 132/84  03/16/21 124/80  10/14/20 138/80   Wt Readings from Last 3 Encounters:  06/03/21 222 lb 12.8 oz (101.1 kg)  10/14/20 230 lb (104.3 kg)  04/15/20 225 lb (102.1 kg)   Body mass index is 39.47 kg/m.   Physical Exam Constitutional:      General: She is not in acute distress.    Appearance: Normal appearance. She is not ill-appearing.  HENT:     Head: Normocephalic and atraumatic.  Cardiovascular:     Rate and Rhythm: Normal rate and regular rhythm.  Pulmonary:     Effort: Pulmonary effort is normal. No respiratory distress.     Breath sounds: No wheezing or rales.  Abdominal:     Tenderness: There is no right CVA tenderness or left CVA tenderness.  Musculoskeletal:        General: Tenderness (No tenderness along lumbar spine, thoracic spine or across lower back.  She does have tenderness with palpation of left lower back near base of ribs) present.  Skin:    General: Skin is warm and dry.  Neurological:     Mental Status: She is alert.           Assessment & Plan:    Acute bilateral lower back without sciatica: Acute Started at least 1 week ago-May be started prior to that Worse with coughing and some changes in  position, but not with all movement She states sometimes she lifts things she should  not and she may have done that higher to the pain starting-she is not sure No radiation, numbness or tingling Found to have tenderness posterior-lateral aspect of left ribs Likely muscular skeletal, but given cough will check chest x-ray, rib x-ray to rule out pneumonia, skeletal lesion Medrol Dosepak If no improvement may need to see sports medicine for lower back pain  Asthma exacerbation: Acute Mild, intermittent asthma and uses inhaler as needed Experiencing increased cough with yellow sputum at times and occasional wheeze No fevers, shortness of breath or other concerning symptoms Symptoms consistent with asthma exacerbation secondary to possible URI Chest x-ray given rib pain and cough/wheeze to rule out pneumonia Continue albuterol as needed Medrol Dosepak If no improvement she will let me know and can consider an antibiotic, but at this time sounds more viral

## 2021-06-03 ENCOUNTER — Ambulatory Visit (INDEPENDENT_AMBULATORY_CARE_PROVIDER_SITE_OTHER): Payer: Medicare Other

## 2021-06-03 ENCOUNTER — Encounter: Payer: Self-pay | Admitting: Internal Medicine

## 2021-06-03 ENCOUNTER — Other Ambulatory Visit: Payer: Self-pay

## 2021-06-03 ENCOUNTER — Ambulatory Visit (INDEPENDENT_AMBULATORY_CARE_PROVIDER_SITE_OTHER): Payer: Medicare Other | Admitting: Internal Medicine

## 2021-06-03 VITALS — BP 132/84 | HR 70 | Temp 98.0°F | Ht 63.0 in | Wt 222.8 lb

## 2021-06-03 DIAGNOSIS — R0781 Pleurodynia: Secondary | ICD-10-CM | POA: Diagnosis not present

## 2021-06-03 DIAGNOSIS — J4521 Mild intermittent asthma with (acute) exacerbation: Secondary | ICD-10-CM

## 2021-06-03 DIAGNOSIS — M545 Low back pain, unspecified: Secondary | ICD-10-CM

## 2021-06-03 DIAGNOSIS — R0789 Other chest pain: Secondary | ICD-10-CM | POA: Diagnosis not present

## 2021-06-03 MED ORDER — METHYLPREDNISOLONE 4 MG PO TBPK: ORAL_TABLET | ORAL | 0 refills | Status: DC

## 2021-06-03 NOTE — Patient Instructions (Addendum)
° ° °  Xrays were ordered - have them done downstairs.       Medications changes include :   medrol dose pak - steroids

## 2021-09-28 ENCOUNTER — Ambulatory Visit: Payer: Medicare Other | Admitting: Nurse Practitioner

## 2021-10-01 ENCOUNTER — Ambulatory Visit (INDEPENDENT_AMBULATORY_CARE_PROVIDER_SITE_OTHER): Payer: No Typology Code available for payment source | Admitting: Nurse Practitioner

## 2021-10-01 VITALS — BP 122/82

## 2021-10-01 DIAGNOSIS — N644 Mastodynia: Secondary | ICD-10-CM

## 2021-10-01 NOTE — Progress Notes (Signed)
   Acute Office Visit  Subjective:    Patient ID: Angela Patton, female    DOB: Jul 20, 1950, 71 y.o.   MRN: [id number]   HPI 71 y.o. presents today for bilateral breast pain. She has long history of breast pain with negative workups. She feels the pain has changed and is now tender to touch at top of her breast.The pain is intermittent, described as sore, mild, and she has bilateral clear nipple discharge with stimulation for a few years. Normal prolactin. Normal mammogram history with most recent 09/2020 with ACR breast density category b. She does not wear very supportive bras and used those that are just easier to put on. She does not sleep in a bra.   Review of Systems  Constitutional: Negative.   Right breast: Positive for pain. Negative for mass, discharge, redness, swelling, or skin changes. Left breast: Positive for pain. Negative for mass, discharge, redness, swelling, or skin changes.     Objective:    Physical Exam Constitutional:      Appearance: Normal appearance.  Chest:  Breasts:    Right: Tenderness present. No swelling, bleeding, inverted nipple, mass, nipple discharge or skin change.     Left: Tenderness present. No swelling, bleeding, inverted nipple, mass, nipple discharge or skin change.    Lymphadenopathy:     Upper Body:     Right upper body: No supraclavicular or axillary adenopathy.     Left upper body: No supraclavicular or axillary adenopathy.     BP 122/82  Wt Readings from Last 3 Encounters:  06/03/21 222 lb 12.8 oz (101.1 kg)  10/14/20 230 lb (104.3 kg)  04/15/20 225 lb (102.1 kg)        Patient informed chaperone available to be present for breast and/or pelvic exam. Patient has requested no chaperone to be present. Patient has been advised what will be completed during breast and pelvic exam.   Assessment & Plan:   Problem List Items Addressed This Visit   None Visit Diagnoses     Bilateral mastodynia    -  Primary      Plan:  Recommend supportive bras without wire all day and even at night if she can tolerate. She has large pendulous breasts and deep grooves in shoulders. Due for screening mammogram, will go ahead and send referral for diagnostic. If normal and supportive bras do not help we discussed option for topical NSAIDs. She is agreeable to plan.      Tamela Gammon DNP, 3:53 PM 10/01/2021

## 2021-10-02 ENCOUNTER — Telehealth: Payer: Self-pay | Admitting: *Deleted

## 2021-10-02 DIAGNOSIS — N644 Mastodynia: Secondary | ICD-10-CM

## 2021-10-02 NOTE — Telephone Encounter (Signed)
-----   Message from Tamela Gammon, NP sent at 10/01/2021  3:56 PM EDT ----- Regarding: Diagnostic mammogram Please send referral for diagnostic mammogram for bilateral breast pain. She is also due for screening mammogram now.

## 2021-10-02 NOTE — Telephone Encounter (Signed)
Patient scheduled at breast center 10/26/21 @ 8:00 am. Patient aware.

## 2021-10-07 NOTE — Telephone Encounter (Signed)
Patient aware she can call daily/weekly for cancellations.

## 2021-10-20 ENCOUNTER — Telehealth: Payer: Self-pay

## 2021-10-20 NOTE — Telephone Encounter (Signed)
Pt calling to report UTI sxs of dysuria and urgency to void. I dont see any slots in your schedule available, is it ok if she comes for nurse/lab visit to leave sample? Please advise.

## 2021-10-26 ENCOUNTER — Ambulatory Visit
Admission: RE | Admit: 2021-10-26 | Discharge: 2021-10-26 | Disposition: A | Discharge status: Home / Self Care | Payer: No Typology Code available for payment source | Source: Ambulatory Visit | Attending: Nurse Practitioner | Admitting: Nurse Practitioner

## 2021-10-26 ENCOUNTER — Ambulatory Visit: Payer: Self-pay

## 2021-10-26 DIAGNOSIS — N6452 Nipple discharge: Secondary | ICD-10-CM | POA: Diagnosis not present

## 2021-10-26 DIAGNOSIS — N644 Mastodynia: Secondary | ICD-10-CM

## 2021-10-29 NOTE — Telephone Encounter (Signed)
Tried to f/u w/ pt. No answer, left DVM per DPR to return call.

## 2021-10-30 NOTE — Telephone Encounter (Signed)
No returned call, ok to close?

## 2021-10-30 NOTE — Telephone Encounter (Signed)
Ok to close

## 2022-01-25 ENCOUNTER — Telehealth: Payer: Self-pay | Admitting: Emergency Medicine

## 2022-01-25 NOTE — Telephone Encounter (Signed)
Left message for patient to call back and schedule Medicare Annual Wellness Visit (AWV).   Please offer to do virtually or by telephone.  Left office number and my jabber [phone].  AWVI eligible as of 09/24/2009  Please schedule at anytime with Nurse Health Advisor.

## 2022-01-27 ENCOUNTER — Telehealth: Payer: Self-pay

## 2022-01-27 NOTE — Telephone Encounter (Signed)
Patient called in voice mail (transferred from front desk voice mail at end of day) and complained of her "left leg keeps swelling". She said she puts it up whenever she sits and at night props it up on a pillow.  She called to ask if anything that can be prescribed for this "like a fluid pill".  I called her right back and received her voice mail. Per DPR access note on file I left detailed message. I advised her that this is an issue to discuss with her Internal Medicine/PCP provider.  I told her that if any redness or leg feeling hot there would be concern regarding blood clot/DVT and she should go to the ER right away to be assessed. Otherwise, to follow up with her Internal Medicine provider.  I did provide our triage phone number if she wants to call me back.

## 2022-02-18 DIAGNOSIS — H524 Presbyopia: Secondary | ICD-10-CM | POA: Diagnosis not present

## 2022-02-18 DIAGNOSIS — Z0100 Encounter for examination of eyes and vision without abnormal findings: Secondary | ICD-10-CM | POA: Diagnosis not present

## 2022-02-23 DIAGNOSIS — Z6838 Body mass index (BMI) 38.0-38.9, adult: Secondary | ICD-10-CM | POA: Diagnosis not present

## 2022-02-23 DIAGNOSIS — M1990 Unspecified osteoarthritis, unspecified site: Secondary | ICD-10-CM | POA: Diagnosis not present

## 2022-02-23 DIAGNOSIS — Z008 Encounter for other general examination: Secondary | ICD-10-CM | POA: Diagnosis not present

## 2022-02-23 DIAGNOSIS — J45909 Unspecified asthma, uncomplicated: Secondary | ICD-10-CM | POA: Diagnosis not present

## 2022-02-23 DIAGNOSIS — Z85038 Personal history of other malignant neoplasm of large intestine: Secondary | ICD-10-CM | POA: Diagnosis not present

## 2022-02-23 DIAGNOSIS — Z86010 Personal history of colonic polyps: Secondary | ICD-10-CM | POA: Diagnosis not present

## 2022-02-24 ENCOUNTER — Encounter: Payer: Self-pay | Admitting: Gastroenterology

## 2022-03-04 ENCOUNTER — Encounter: Payer: Self-pay | Admitting: Gastroenterology

## 2022-03-12 ENCOUNTER — Ambulatory Visit (AMBULATORY_SURGERY_CENTER): Payer: Self-pay

## 2022-03-12 ENCOUNTER — Other Ambulatory Visit: Payer: Self-pay

## 2022-03-12 VITALS — Ht 63.0 in | Wt 216.8 lb

## 2022-03-12 DIAGNOSIS — Z85038 Personal history of other malignant neoplasm of large intestine: Secondary | ICD-10-CM

## 2022-03-12 MED ORDER — NA SULFATE-K SULFATE-MG SULF 17.5-3.13-1.6 GM/177ML PO SOLN: 1.0000 | Freq: Once | ORAL | 0 refills | Status: AC

## 2022-03-12 NOTE — Progress Notes (Signed)
Denies allergies to eggs or soy products. Denies complication of anesthesia or sedation. Denies use of weight loss medication. Denies use of O2.   Emmi instructions given for colonoscopy.  

## 2022-04-06 ENCOUNTER — Encounter: Payer: Self-pay | Admitting: Gastroenterology

## 2022-04-08 ENCOUNTER — Encounter: Payer: Self-pay | Admitting: Certified Registered Nurse Anesthetist

## 2022-04-09 ENCOUNTER — Ambulatory Visit (AMBULATORY_SURGERY_CENTER): Payer: No Typology Code available for payment source | Admitting: Gastroenterology

## 2022-04-09 ENCOUNTER — Encounter: Payer: Self-pay | Admitting: Gastroenterology

## 2022-04-09 VITALS — BP 145/67 | HR 73 | Temp 98.1°F | Resp 22 | Ht 63.0 in | Wt 216.8 lb

## 2022-04-09 DIAGNOSIS — Z86010 Personal history of colonic polyps: Secondary | ICD-10-CM | POA: Diagnosis not present

## 2022-04-09 DIAGNOSIS — Z1211 Encounter for screening for malignant neoplasm of colon: Secondary | ICD-10-CM | POA: Diagnosis not present

## 2022-04-09 DIAGNOSIS — Z85038 Personal history of other malignant neoplasm of large intestine: Secondary | ICD-10-CM | POA: Diagnosis not present

## 2022-04-09 DIAGNOSIS — Z08 Encounter for follow-up examination after completed treatment for malignant neoplasm: Secondary | ICD-10-CM | POA: Diagnosis not present

## 2022-04-09 DIAGNOSIS — I2510 Atherosclerotic heart disease of native coronary artery without angina pectoris: Secondary | ICD-10-CM | POA: Diagnosis not present

## 2022-04-09 DIAGNOSIS — I252 Old myocardial infarction: Secondary | ICD-10-CM | POA: Diagnosis not present

## 2022-04-09 MED ORDER — SODIUM CHLORIDE 0.9 % IV SOLN: 500.0000 mL | Freq: Once | INTRAVENOUS | Status: DC

## 2022-04-09 NOTE — Progress Notes (Signed)
History and Physical:  This patient presents for endoscopic testing for: Encounter Diagnosis  Name Primary?   Personal history of colon cancer Yes    Last colonoscopy November 2018, diminutive ascending colon polyp removed, pathology unknown as tissue not retrieved. This patient has also had a prior resection for colorectal cancer.  Patient is otherwise without complaints or active issues today.   Past Medical History: Past Medical History:  Diagnosis Date   Allergy    Anemia    Arthritis    Asthma    Cancer ([place])    colon    Chest pain    Hospital 09/2010,  /   Stress echo October 29, 2010, vigorous LV function with stress.  All walls could not be assessed fully but it was felt that there was no ischemia.   Coronary artery disease    MI per patient 2007 elsewhere, no records, pt. says cath  was [facility]   Diverticulitis    Elevated CPK    Hospital 09/2010,  troponin normal   GERD (gastroesophageal reflux disease)    Hyperlipidemia    Hypertension    Kidney stone    MI (myocardial infarction) ([place])    2007     Past Surgical History: Past Surgical History:  Procedure Laterality Date   ABDOMINAL HYSTERECTOMY     CESAREAN SECTION     x 2   COLON SURGERY     2004   FOOT SURGERY     left 1999   HERNIA REPAIR     three total, 2007   SHOULDER SURGERY     left shoulder 2008   TOTAL HIP ARTHROPLASTY     right hip 2007    Allergies: Allergies  Allergen Reactions   Penicillins Hives and Swelling    Has patient had a PCN reaction causing immediate rash, facial/tongue/throat swelling, SOB or lightheadedness with hypotension:  Has patient had a PCN reaction causing severe rash involving mucus membranes or skin necrosis:  Has patient had a PCN reaction that required hospitalization:  Has patient had a PCN reaction occurring within the last 10 years:  If all of the above answers are "NO", then may proceed with Cephalosporin use.     Outpatient Meds: Current Outpatient  Medications  Medication Sig Dispense Refill   albuterol (PROVENTIL HFA;VENTOLIN HFA) 108 (90 BASE) MCG/ACT inhaler Inhale 1-2 puffs into the lungs every 6 (six) hours as needed for wheezing or shortness of breath. 1 Inhaler 0   Ibuprofen (ADVIL) 200 MG CAPS Take by mouth.     Current Facility-Administered Medications  Medication Dose Route Frequency Provider Last Rate Last Admin   0.9 %  sodium chloride infusion  500 mL Intravenous Once Doran Stabler, MD          ___________________________________________________________________ Objective   Exam:  BP (!) 149/70   Pulse 87   Temp 98.1 F (36.7 C)   Ht '5\' 3"'$  (1.6 m)   Wt 216 lb 12.8 oz (98.3 kg)   SpO2 99%   BMI 38.40 kg/m   CV: regular , S1/S2 Resp: clear to auscultation bilaterally, normal RR and effort noted GI: soft, no tenderness, with active bowel sounds.   Assessment: Encounter Diagnosis  Name Primary?   Personal history of colon cancer Yes     Plan: Colonoscopy  The benefits and risks of the planned procedure were described in detail with the patient or (when appropriate) their health care proxy.  Risks were outlined as including, but not limited  to, bleeding, infection, perforation, adverse medication reaction leading to cardiac or pulmonary decompensation, pancreatitis (if ERCP).  The limitation of incomplete mucosal visualization was also discussed.  No guarantees or warranties were given.    The patient is appropriate for an endoscopic procedure in the ambulatory setting.   - Wilfrid Lund, MD

## 2022-04-09 NOTE — Progress Notes (Signed)
Report given to PACU, vss 

## 2022-04-09 NOTE — Patient Instructions (Signed)
Discharge instructions given. Normal exam. Resume previous medications. YOU HAD AN ENDOSCOPIC PROCEDURE TODAY AT THE [facility]:   Refer to the procedure report that was given to you for any specific questions about what was found during the examination.  If the procedure report does not answer your questions, please call your gastroenterologist to clarify.  If you requested that your care partner not be given the details of your procedure findings, then the procedure report has been included in a sealed envelope for you to review at your convenience later.  YOU SHOULD EXPECT: Some feelings of bloating in the abdomen. Passage of more gas than usual.  Walking can help get rid of the air that was put into your GI tract during the procedure and reduce the bloating. If you had a lower endoscopy (such as a colonoscopy or flexible sigmoidoscopy) you may notice spotting of blood in your stool or on the toilet paper. If you underwent a bowel prep for your procedure, you may not have a normal bowel movement for a few days.  Please Note:  You might notice some irritation and congestion in your nose or some drainage.  This is from the oxygen used during your procedure.  There is no need for concern and it should clear up in a day or so.  SYMPTOMS TO REPORT IMMEDIATELY:  Following lower endoscopy (colonoscopy or flexible sigmoidoscopy):  Excessive amounts of blood in the stool  Significant tenderness or worsening of abdominal pains  Swelling of the abdomen that is new, acute  Fever of 100F or higher   For urgent or emergent issues, a gastroenterologist can be reached at any hour by calling [phone]. Do not use MyChart messaging for urgent concerns.    DIET:  We do recommend a small meal at first, but then you may proceed to your regular diet.  Drink plenty of fluids but you should avoid alcoholic beverages for 24 hours.  ACTIVITY:  You should plan to take it easy for the rest of  today and you should NOT DRIVE or use heavy machinery until tomorrow (because of the sedation medicines used during the test).    FOLLOW UP: Our staff will call the number listed on your records the next business day following your procedure.  We will call around 7:15- 8:00 am to check on you and address any questions or concerns that you may have regarding the information given to you following your procedure. If we do not reach you, we will leave a message.     If any biopsies were taken you will be contacted by phone or by letter within the next 1-3 weeks.  Please call us at [phone] if you have not heard about the biopsies in 3 weeks.    SIGNATURES/CONFIDENTIALITY: You and/or your care partner have signed paperwork which will be entered into your electronic medical record.  These signatures attest to the fact that that the information above on your After Visit Summary has been reviewed and is understood.  Full responsibility of the confidentiality of this discharge information lies with you and/or your care-partner. 

## 2022-04-09 NOTE — Op Note (Signed)
[name] Procedure Date: 04/09/2022 1:26 PM MRN: [id number] Endoscopist: Mallie Mussel L. [name] , MD, [id number] Age: 71 Referring MD:  Date of Birth: 1950/07/08 Gender: Female Account #: [id number] Procedure:                Colonoscopy Indications:              Surveillance: Personal history of colonic polyps                            (unknown histology) on last colonoscopy 5 years ago                            (Nov 2018), High risk colon cancer surveillance:                            Personal history of colon cancer Medicines:                Monitored Anesthesia Care Procedure:                Pre-Anesthesia Assessment:                           - Prior to the procedure, a History and Physical                            was performed, and patient medications and                            allergies were reviewed. The patient's tolerance of                            previous anesthesia was also reviewed. The risks                            and benefits of the procedure and the sedation                            options and risks were discussed with the patient.                            All questions were answered, and informed consent                            was obtained. Prior Anticoagulants: The patient has                            taken no anticoagulant or antiplatelet agents. ASA                            Grade Assessment: III - A patient with severe                            systemic disease. After reviewing the risks and  benefits, the patient was deemed in satisfactory                            condition to undergo the procedure.                           After obtaining informed consent, the colonoscope                            was passed under direct vision. Throughout the                            procedure, the patient's blood pressure, pulse, and                            oxygen saturations  were monitored continuously. The                            Olympus Scope [id number] was introduced through the                            anus and advanced to the the cecum, identified by                            appendiceal orifice and ileocecal valve. The                            colonoscopy was performed without difficulty. The                            patient tolerated the procedure well. The quality                            of the bowel preparation was good. The ileocecal                            valve, appendiceal orifice, and rectum were                            photographed. Scope In: 1:47:01 PM Scope Out: 2:02:54 PM Scope Withdrawal Time: 0 hours 9 minutes 3 seconds  Total Procedure Duration: 0 hours 15 minutes 53 seconds  Findings:                 The perianal and digital rectal examinations were                            normal.                           Repeat examination of right colon under NBI                            performed.  There was evidence of a prior side-to-side                            colo-colonic anastomosis in the descending colon.                            This anastomosis has an irregular shape, with a                            blind stump in the lumen to the more proximal colon                            off and an eccentric angle. This was patent and was                            characterized by healthy appearing mucosa. The                            anastomosis was traversed.                           The exam was otherwise without abnormality on                            direct view. (retroflexion not performed in rectum                            due to narrow anatomy) Complications:            No immediate complications. Estimated Blood Loss:     Estimated blood loss: none. Impression:               - Patent side-to-side colo-colonic anastomosis,                            characterized by healthy  appearing mucosa.                           - The examination was otherwise normal on direct                            and retroflexion views.                           - No specimens collected. Recommendation:           - Patient has a contact number available for                            emergencies. The signs and symptoms of potential                            delayed complications were discussed with the                            patient. Return to normal  activities tomorrow.                            Written discharge instructions were provided to the                            patient.                           - Resume previous diet.                           - Continue present medications.                           - Repeat colonoscopy in 5 years for surveillance. Rayshawn Visconti L. [name], MD 04/09/2022 2:09:49 PM This report has been signed electronically.

## 2022-04-09 NOTE — Progress Notes (Signed)
VS by DT  Pt's states no medical or surgical changes since previsit or office visit.  

## 2022-04-12 ENCOUNTER — Telehealth: Payer: Self-pay

## 2022-04-12 NOTE — Telephone Encounter (Signed)
Left message on answering machine. 

## 2022-04-30 ENCOUNTER — Ambulatory Visit (INDEPENDENT_AMBULATORY_CARE_PROVIDER_SITE_OTHER): Payer: No Typology Code available for payment source

## 2022-04-30 VITALS — Ht 63.0 in | Wt 215.0 lb

## 2022-04-30 DIAGNOSIS — Z0000 Encounter for general adult medical examination without abnormal findings: Secondary | ICD-10-CM | POA: Diagnosis not present

## 2022-04-30 NOTE — Progress Notes (Signed)
Virtual Visit via Telephone Note  I connected with  Worthy Keeler on 04/30/22 at 10:30 AM EST by telephone and verified that I am speaking with the correct person using two identifiers.  Location: Patient: Home Provider: Albert Lea Persons participating in the virtual visit: [place]   I discussed the limitations, risks, security and privacy concerns of performing an evaluation and management service by telephone and the availability of in person appointments. The patient expressed understanding and agreed to proceed.  Interactive audio and video telecommunications were attempted between this nurse and patient, however failed, due to patient having technical difficulties OR patient did not have access to video capability.  We continued and completed visit with audio only.  Some vital signs may be absent or patient reported.   Sheral Flow, LPN  Subjective:   MIKAELAH TROSTLE is a 72 y.o. female who presents for an Initial Medicare Annual Wellness Visit.  Review of Systems     Cardiac Risk Factors include: advanced age (>90mn, >>42women);obesity (BMI >30kg/m2)     Objective:    Today's Vitals   04/30/22 1032  Weight: 215 lb (97.5 kg)  Height: '5\' 3"'$  (1.6 m)  PainSc: 0-No pain   Body mass index is 38.09 kg/m.     04/30/2022   10:34 AM 03/04/2017    9:43 AM 02/26/2017   12:51 PM 02/08/2017   12:27 PM 04/28/2016    4:56 PM 09/10/2014    3:12 PM 07/09/2014    5:54 PM  Advanced Directives  Does Patient Have a Medical Advance Directive? Yes No No No No;Yes No No  Type of [place]    [place]    Copy of [place] Chart? No - copy requested    No - copy requested    Would patient like information on creating a medical advance directive?    Yes (ED - Information included in AVS)   No - patient declined information    Current Medications  (verified) Outpatient Encounter Medications as of 04/30/2022  Medication Sig   albuterol (PROVENTIL HFA;VENTOLIN HFA) 108 (90 BASE) MCG/ACT inhaler Inhale 1-2 puffs into the lungs every 6 (six) hours as needed for wheezing or shortness of breath.   Ibuprofen (ADVIL) 200 MG CAPS Take by mouth.   No facility-administered encounter medications on file as of 04/30/2022.    Allergies (verified) Penicillins   History: Past Medical History:  Diagnosis Date   Allergy    Anemia    Arthritis    Asthma    Cancer ([place]    colon    Chest pain    Hospital 09/2010,  /   Stress echo October 29, 2010, vigorous LV function with stress.  All walls could not be assessed fully but it was felt that there was no ischemia.   Coronary artery disease    [facility] per patient 2007 [facility], no records[facility]  Diverticulitis    Elevated CPK    Hospital 09/2010,  troponin normal   GERD (gastroesophageal reflux disease)    Hyperlipidemia    Hypertension    Kidney stone    [place]   Past Surgical History:  Procedure Laterality Date   ABDOMINAL HYSTERECTOMY     CESAREAN SECTION     x 2   COLON SURGERY     2004   FOOT SURGERY  left 1999   HERNIA REPAIR     three total, 2007   SHOULDER SURGERY     left shoulder 2008   TOTAL HIP ARTHROPLASTY     right hip 2007   Family History  Problem Relation Age of Onset   Bone cancer Mother    Colon cancer Father    Diabetes Sister    Diabetes Sister    Diabetes Sister    Hodgkin's lymphoma Brother    Stomach cancer Brother    Esophageal cancer Neg Hx    Rectal cancer Neg Hx    Social History   Socioeconomic History   Marital status: Married    Spouse name: Not on file   Number of children: Not on file   Years of education: Not on file   Highest education level: Not on file  Occupational History   Not on file  Tobacco Use   Smoking status: Never   Smokeless tobacco: Never  Vaping Use   Vaping Use:  Never used  Substance and Sexual Activity   Alcohol use: Yes    Comment: Rare   Drug use: No   Sexual activity: Never    Comment: 1st intercourse 72 yo-Fewer than 5 partners  Other Topics Concern   Not on file  Social History Narrative   Not on file   Social Determinants of Health   Financial Resource Strain: Low Risk  (04/30/2022)   Overall Financial Resource Strain (CARDIA)    Difficulty of Paying Living Expenses: Not hard at all  Food Insecurity: No Food Insecurity (04/30/2022)   Hunger Vital Sign    Worried About Running Out of Food in the Last Year: Never true    [place] in the Last Year: Never true  Transportation Needs: No Transportation Needs (04/30/2022)   PRAPARE - Hydrologist (Medical): No    Lack of Transportation (Non-Medical): No  Physical Activity: Sufficiently Active (04/30/2022)   Exercise Vital Sign    Days of Exercise per Week: 5 days    Minutes of Exercise per Session: 30 min  Stress: No Stress Concern Present (04/30/2022)   [place]    Feeling of Stress : Not at all  Social Connections: [place] (04/30/2022)   Social Connection and Isolation Panel [NHANES]    Frequency of Communication with Friends and Family: More than three times a week    Frequency of Social Gatherings with Friends and Family: More than three times a week    Attends Religious Services: More than 4 times per year    Active Member of [employer] or Organizations: Yes    Attends Music therapist: More than 4 times per year    Marital Status: Married    Tobacco Counseling Counseling given: Not Answered   Clinical Intake:  Pre-visit preparation completed: Yes  Pain : No/denies pain Pain Score: 0-No pain     BMI - recorded: 38.09 Nutritional Status: BMI > 30  Obese Nutritional Risks: None Diabetes: No  How often do you need to have someone help you when you read  instructions, pamphlets, or other written materials from your doctor or pharmacy?: 1 - Never What is the last grade level you completed in school?: HSG  Diabetic? No  Interpreter Needed?: No  Information entered by :: Lisette Abu, LPN.   Activities of Daily Living    04/30/2022   10:38 AM  In your present state  of health, do you have any difficulty performing the following activities:  Hearing? 0  Vision? 0  Difficulty concentrating or making decisions? 0  Walking or climbing stairs? 1  Dressing or bathing? 0  Doing errands, shopping? 0  Preparing Food and eating ? N  Using the Toilet? N  In the past six months, have you accidently leaked urine? Y  Comment wears Depends  Do you have problems with loss of bowel control? N  Managing your Medications? N  Managing your Finances? N  Housekeeping or managing your Housekeeping? N    Patient Care Team: Horald Pollen, MD as PCP - General (Internal Medicine) Laurence Aly, [place] as Consulting Physician (Optometry)  Indicate any recent Medical Services you may have received from other than [facility] providers in the past year (date may be approximate).     Assessment:   This is a routine wellness examination for Nariah.  Hearing/Vision screen Hearing Screening - Comments:: Denies hearing difficulties.  Vision Screening - Comments:: Wears rx glasses - up to date with routine eye exams with Laurence Aly, OD.    Dietary issues and exercise activities discussed: Current Exercise Habits: Home exercise routine, Type of exercise: walking, Time (Minutes): 30, Frequency (Times/Week): 5, Weekly Exercise (Minutes/Week): 150, Intensity: Mild, Exercise limited by: None identified   Goals Addressed             This Visit's Progress    My goal for 2024 is to live each day as I can.        Depression Screen    04/30/2022   10:38 AM 06/03/2021   10:12 AM 10/14/2020   11:12 AM  PHQ 2/9 Scores  PHQ - 2 Score 0 0 0    Fall Risk     04/30/2022   10:35 AM 06/03/2021   10:12 AM 10/14/2020   11:12 AM  Fall Risk   Falls in the past year? 0 0 1  Number falls in past yr: 0 0 0  Injury with Fall? 0 0 0  Risk for fall due to : No Fall Risks  No Fall Risks  Follow up Falls prevention discussed  Falls evaluation completed    FALL RISK PREVENTION PERTAINING TO THE HOME:  Any stairs in or around the home? Yes  If so, are there any without handrails? No  Home free of loose throw rugs in walkways, pet beds, electrical cords, etc? Yes  Adequate lighting in your home to reduce risk of falls? Yes   ASSISTIVE DEVICES UTILIZED TO PREVENT FALLS:  Life alert? No  Use of a cane, walker or w/c? No  Grab bars in the bathroom? No  Shower chair or bench in shower? Yes  Elevated toilet seat or a handicapped toilet? Yes   TIMED UP AND GO:  Was the test performed? No . Phone Visit  Cognitive Function:        04/30/2022   10:35 AM  6CIT Screen  What Year? 0 points  What month? 0 points  What time? 0 points  Count back from 20 0 points  Months in reverse 0 points  Repeat phrase 0 points  Total Score 0 points    Immunizations Immunization History  Administered Date(s) Administered   PFIZER(Purple Top)SARS-COV-2 Vaccination 02/07/2020, 03/05/2020   Pfizer Covid-19 Vaccine Bivalent Booster 12yr & up 09/03/2020   Tdap 07/04/2014    TDAP status: Up to date  Flu Vaccine status: Declined, Education has been provided regarding the importance of this vaccine but  patient still declined. Advised may receive this vaccine at local pharmacy or Health Dept. Aware to provide a copy of the vaccination record if obtained from local pharmacy or Health Dept. Verbalized acceptance and understanding.  Pneumococcal vaccine status: Declined,  Education has been provided regarding the importance of this vaccine but patient still declined. Advised may receive this vaccine at local pharmacy or Health Dept. Aware to provide a copy of the vaccination  record if obtained from local pharmacy or Health Dept. Verbalized acceptance and understanding.   Covid-19 vaccine status: Completed vaccines  Qualifies for Shingles Vaccine? Yes   Zostavax completed No   Shingrix Completed?: No.    Education has been provided regarding the importance of this vaccine. Patient has been advised to call insurance company to determine out of pocket expense if they have not yet received this vaccine. Advised may also receive vaccine at local pharmacy or Health Dept. Verbalized acceptance and understanding.  Screening Tests Health Maintenance  Topic Date Due   Hepatitis C Screening  Never done   COVID-19 Vaccine (4 - 2023-24 season) 05/16/2022 (Originally 12/25/2021)   Medicare Annual Wellness (AWV)  05/01/2023   MAMMOGRAM  10/27/2023   DTaP/Tdap/Td (2 - Td or Tdap) 07/03/2024   COLONOSCOPY (Pts 45-45yr Insurance coverage will need to be confirmed)  04/10/2027   DEXA SCAN  Completed   HPV VACCINES  Aged Out   Pneumonia Vaccine 72 Years old  Discontinued   INFLUENZA VACCINE  Discontinued   Zoster Vaccines- Shingrix  Discontinued    Health Maintenance  Health Maintenance Due  Topic Date Due   Hepatitis C Screening  Never done    Colorectal cancer screening: Type of screening: Colonoscopy. Completed 04/09/2022. Repeat every 5 years  Mammogram status: Completed 10/26/2021. Repeat every year  Bone Density status: Completed 03/31/2017. Results reflect: Bone density results: NORMAL. Repeat every 5 years.  Lung Cancer Screening: (Low Dose CT Chest recommended if Age 108-80years, 30 pack-year currently smoking OR have quit w/in 15years.) does not qualify.   Lung Cancer Screening Referral: no  Additional Screening:  Hepatitis C Screening: does qualify; Completed no  Vision Screening: Recommended annual ophthalmology exams for early detection of glaucoma and other disorders of the eye. Is the patient up to date with their annual eye exam?  Yes  Who is the  provider or what is the name of the office in which the patient attends annual eye exams? JLaurence Aly OD  If pt is not established with a provider, would they like to be referred to a provider to establish care? No .   Dental Screening: Recommended annual dental exams for proper oral hygiene  Community Resource Referral / Chronic Care Management: CRR required this visit?  No   CCM required this visit?  No .     Plan:     I have personally reviewed and noted the following in the patient's chart:   Medical and social history Use of alcohol, tobacco or illicit drugs  Current medications and supplements including opioid prescriptions. Patient is not currently taking opioid prescriptions. Functional ability and status Nutritional status Physical activity Advanced directives List of other physicians Hospitalizations, surgeries, and ER visits in previous 12 months Vitals Screenings to include cognitive, depression, and falls Referrals and appointments  In addition, I have reviewed and discussed with patient certain preventive protocols, quality metrics, and best practice recommendations. A written personalized care plan for preventive services as well as general preventive health recommendations were provided to patient.  Sheral Flow, LPN   09/24/5181   Nurse Notes: N/A

## 2022-04-30 NOTE — Patient Instructions (Signed)
Angela Patton , Thank you for taking time to come for your Medicare Wellness Visit. I appreciate your ongoing commitment to your health goals. Please review the following plan we discussed and let me know if I can assist you in the future.   These are the goals we discussed:  Goals      My goal for 2024 is to live each day as I can.        This is a list of the screening recommended for you and due dates:  Health Maintenance  Topic Date Due   Hepatitis C Screening: USPSTF Recommendation to screen - Ages 22-79 yo.  Never done   COVID-19 Vaccine (4 - 2023-24 season) 05/16/2022*   Medicare Annual Wellness Visit  05/01/2023   Mammogram  10/27/2023   DTaP/Tdap/Td vaccine (2 - Td or Tdap) 07/03/2024   Colon Cancer Screening  04/10/2027   DEXA scan (bone density measurement)  Completed   HPV Vaccine  Aged Out   Pneumonia Vaccine  Discontinued   Flu Shot  Discontinued   Zoster (Shingles) Vaccine  Discontinued  *Topic was postponed. The date shown is not the original due date.    Advanced directives: No  Conditions/risks identified: Yes  Next appointment: Follow up in one year for your annual wellness visit.   Preventive Care 13 Years and Older, Female Preventive care refers to lifestyle choices and visits with your health care provider that can promote health and wellness. What does preventive care include? A yearly physical exam. This is also called an annual well check. Dental exams once or twice a year. Routine eye exams. Ask your health care provider how often you should have your eyes checked. Personal lifestyle choices, including: Daily care of your teeth and gums. Regular physical activity. Eating a healthy diet. Avoiding tobacco and drug use. Limiting alcohol use. Practicing safe sex. Taking low-dose aspirin every day. Taking vitamin and mineral supplements as recommended by your health care provider. What happens during an annual well check? The services and screenings  done by your health care provider during your annual well check will depend on your age, overall health, lifestyle risk factors, and family history of disease. Counseling  Your health care provider may ask you questions about your: Alcohol use. Tobacco use. Drug use. Emotional well-being. Home and relationship well-being. Sexual activity. Eating habits. History of falls. Memory and ability to understand (cognition). Work and work Statistician. Reproductive health. Screening  You may have the following tests or measurements: Height, weight, and BMI. Blood pressure. Lipid and cholesterol levels. These may be checked every 5 years, or more frequently if you are over 57 years old. Skin check. Lung cancer screening. You may have this screening every year starting at age 98 if you have a 30-pack-year history of smoking and currently smoke or have quit within the past 15 years. Fecal occult blood test (FOBT) of the stool. You may have this test every year starting at age 73. Flexible sigmoidoscopy or colonoscopy. You may have a sigmoidoscopy every 5 years or a colonoscopy every 10 years starting at age 37. Hepatitis C blood test. Hepatitis B blood test. Sexually transmitted disease (STD) testing. Diabetes screening. This is done by checking your blood sugar (glucose) after you have not eaten for a while (fasting). You may have this done every 1-3 years. Bone density scan. This is done to screen for osteoporosis. You may have this done starting at age 36. Mammogram. This may be done every 1-2 years. Talk  to your health care provider about how often you should have regular mammograms. Talk with your health care provider about your test results, treatment options, and if necessary, the need for more tests. Vaccines  Your health care provider may recommend certain vaccines, such as: Influenza vaccine. This is recommended every year. Tetanus, diphtheria, and acellular pertussis (Tdap, Td)  vaccine. You may need a Td booster every 10 years. Zoster vaccine. You may need this after age 61. Pneumococcal 13-valent conjugate (PCV13) vaccine. One dose is recommended after age 64. Pneumococcal polysaccharide (PPSV23) vaccine. One dose is recommended after age 17. Talk to your health care provider about which screenings and vaccines you need and how often you need them. This information is not intended to replace advice given to you by your health care provider. Make sure you discuss any questions you have with your health care provider. Document Released: 05/09/2015 Document Revised: 12/31/2015 Document Reviewed: 02/11/2015 Elsevier Interactive Patient Education  2017 [place] Prevention in the Home Falls can cause injuries. They can happen to people of all ages. There are many things you can do to make your home safe and to help prevent falls. What can I do on the outside of my home? Regularly fix the edges of walkways and driveways and fix any cracks. Remove anything that might make you trip as you walk through a door, such as a raised step or threshold. Trim any bushes or trees on the path to your home. Use bright outdoor lighting. Clear any walking paths of anything that might make someone trip, such as rocks or tools. Regularly check to see if handrails are loose or broken. Make sure that both sides of any steps have handrails. Any raised decks and porches should have guardrails on the edges. Have any leaves, snow, or ice cleared regularly. Use sand or salt on walking paths during winter. Clean up any spills in your garage right away. This includes oil or grease spills. What can I do in the bathroom? Use night lights. Install grab bars by the toilet and in the tub and shower. Do not use towel bars as grab bars. Use non-skid mats or decals in the tub or shower. If you need to sit down in the shower, use a plastic, non-slip stool. Keep the floor dry. Clean up any  water that spills on the floor as soon as it happens. Remove soap buildup in the tub or shower regularly. Attach bath mats securely with double-sided non-slip rug tape. Do not have throw rugs and other things on the floor that can make you trip. What can I do in the bedroom? Use night lights. Make sure that you have a light by your bed that is easy to reach. Do not use any sheets or blankets that are too big for your bed. They should not hang down onto the floor. Have a firm chair that has side arms. You can use this for support while you get dressed. Do not have throw rugs and other things on the floor that can make you trip. What can I do in the kitchen? Clean up any spills right away. Avoid walking on wet floors. Keep items that you use a lot in easy-to-reach places. If you need to reach something above you, use a strong step stool that has a grab bar. Keep electrical cords out of the way. Do not use floor polish or wax that makes floors slippery. If you must use wax, use non-skid floor wax. Do  not have throw rugs and other things on the floor that can make you trip. What can I do with my stairs? Do not leave any items on the stairs. Make sure that there are handrails on both sides of the stairs and use them. Fix handrails that are broken or loose. Make sure that handrails are as long as the stairways. Check any carpeting to make sure that it is firmly attached to the stairs. Fix any carpet that is loose or worn. Avoid having throw rugs at the top or bottom of the stairs. If you do have throw rugs, attach them to the floor with carpet tape. Make sure that you have a light switch at the top of the stairs and the bottom of the stairs. If you do not have them, ask someone to add them for you. What else can I do to help prevent falls? Wear shoes that: Do not have high heels. Have rubber bottoms. Are comfortable and fit you well. Are closed at the toe. Do not wear sandals. If you use a  stepladder: Make sure that it is fully opened. Do not climb a closed stepladder. Make sure that both sides of the stepladder are locked into place. Ask someone to hold it for you, if possible. Clearly mark and make sure that you can see: Any grab bars or handrails. First and last steps. Where the edge of each step is. Use tools that help you move around (mobility aids) if they are needed. These include: Canes. Walkers. Scooters. Crutches. Turn on the lights when you go into a dark area. Replace any light bulbs as soon as they burn out. Set up your furniture so you have a clear path. Avoid moving your furniture around. If any of your floors are uneven, fix them. If there are any pets around you, be aware of where they are. Review your medicines with your doctor. Some medicines can make you feel dizzy. This can increase your chance of falling. Ask your doctor what other things that you can do to help prevent falls. This information is not intended to replace advice given to you by your health care provider. Make sure you discuss any questions you have with your health care provider. Document Released: 02/06/2009 Document Revised: 09/18/2015 Document Reviewed: 05/17/2014 Elsevier Interactive Patient Education  2017 [employer].

## 2022-07-24 DIAGNOSIS — R0981 Nasal congestion: Secondary | ICD-10-CM | POA: Diagnosis not present

## 2022-07-24 DIAGNOSIS — J029 Acute pharyngitis, unspecified: Secondary | ICD-10-CM | POA: Diagnosis not present

## 2022-07-24 DIAGNOSIS — U071 COVID-19: Secondary | ICD-10-CM | POA: Diagnosis not present

## 2022-07-24 DIAGNOSIS — R509 Fever, unspecified: Secondary | ICD-10-CM | POA: Diagnosis not present

## 2022-07-24 DIAGNOSIS — R6889 Other general symptoms and signs: Secondary | ICD-10-CM | POA: Diagnosis not present

## 2022-11-17 ENCOUNTER — Telehealth: Payer: Self-pay

## 2022-11-17 NOTE — Telephone Encounter (Signed)
Pt scheduled on 11/18/2022. Will route to provider for final review and close.

## 2022-11-17 NOTE — Telephone Encounter (Signed)
Pt notified and voiced understanding. Msg sent to appt desk.

## 2022-11-17 NOTE — Telephone Encounter (Signed)
Pt LVM in triage line stating that when she urinates, she can feel a burning sensation in inner labia and is requesting rx to be sent to pharmacy.   Last seen for OV 10/01/2021 Last AEX w/ JK in 03/2020--scheduled for B&P on 12/29/2022   Please advise.

## 2022-11-17 NOTE — Telephone Encounter (Signed)
Needs OV.  Thanks. 

## 2022-11-18 ENCOUNTER — Encounter: Payer: Self-pay | Admitting: Nurse Practitioner

## 2022-11-18 ENCOUNTER — Ambulatory Visit (INDEPENDENT_AMBULATORY_CARE_PROVIDER_SITE_OTHER): Payer: No Typology Code available for payment source | Admitting: Nurse Practitioner

## 2022-11-18 VITALS — BP 128/74 | HR 80 | Wt 201.0 lb

## 2022-11-18 DIAGNOSIS — R300 Dysuria: Secondary | ICD-10-CM

## 2022-11-18 MED ORDER — SULFAMETHOXAZOLE-TRIMETHOPRIM 800-160 MG PO TABS: 1.0000 | ORAL_TABLET | Freq: Two times a day (BID) | ORAL | 0 refills | Status: AC

## 2022-11-18 MED ORDER — PHENAZOPYRIDINE HCL 200 MG PO TABS: 200.0000 mg | ORAL_TABLET | Freq: Three times a day (TID) | ORAL | 0 refills | Status: DC | PRN

## 2022-11-18 NOTE — Progress Notes (Signed)
   Acute Office Visit  Subjective:    Patient ID: GRISELDA BRAMBLETT, female    DOB: August 01, 1950, 72 y.o.   MRN: [id number]   HPI 72 y.o. presents today for burning with urination and urgency. Complains of severe burning at end of stream. Unable to leave sample today due to discomfort. Denies frequency, hematuria, back pain or fever.   No LMP recorded. Patient has had a hysterectomy.    Review of Systems  Constitutional: Negative.   Genitourinary:  Positive for difficulty urinating (Due to discomfort), dysuria and urgency. Negative for flank pain, frequency and hematuria.       Objective:    Physical Exam Constitutional:      Appearance: Normal appearance.  Abdominal:     Tenderness: There is no right CVA tenderness or left CVA tenderness.  Genitourinary:    General: Normal vulva.     BP 128/74   Pulse 80   Wt 201 lb (91.2 kg)   SpO2 98%   BMI 35.61 kg/m  Wt Readings from Last 3 Encounters:  11/18/22 201 lb (91.2 kg)  04/30/22 215 lb (97.5 kg)  04/09/22 216 lb 12.8 oz (98.3 kg)        Patient informed chaperone available to be present for breast and/or pelvic exam. Patient has requested no chaperone to be present. Patient has been advised what will be completed during breast and pelvic exam.   Assessment & Plan:   Problem List Items Addressed This Visit   None Visit Diagnoses     Dysuria    -  Primary   Relevant Medications   sulfamethoxazole-trimethoprim (BACTRIM DS) 800-160 MG tablet   phenazopyridine (PYRIDIUM) 200 MG tablet   Other Relevant Orders   Urine Culture        Plan: In and out cath performed to obtain sample using sterile technique. Carolynn Serve, CMA assisted and chaperoned. Tolerated well.   Not enough output for UA, will send off for culture only. Bactrim 800-160 mg BID x 3 days. Pyridium 200 mg TID for no more than 2 days.      Olivia Mackie DNP, 3:31 PM 11/18/2022

## 2022-12-07 ENCOUNTER — Ambulatory Visit (INDEPENDENT_AMBULATORY_CARE_PROVIDER_SITE_OTHER): Payer: No Typology Code available for payment source | Admitting: Radiology

## 2022-12-07 VITALS — BP 140/80 | Temp 98.0°F

## 2022-12-07 DIAGNOSIS — R102 Pelvic and perineal pain: Secondary | ICD-10-CM | POA: Diagnosis not present

## 2022-12-07 NOTE — Addendum Note (Signed)
Addended by: Rushie Goltz on: 12/07/2022 11:35 AM   Modules accepted: Orders

## 2022-12-07 NOTE — Progress Notes (Signed)
      Subjective: Angela Patton is a 72 y.o. female who complains of right sided abdominal pain. Hx diverticulitis. Only notices symptoms after eating nuts. Ate a large quantity of nuts 2 days ago. Denies urinary complaints today (unable to collect urine sample).     Review of Systems  All other systems reviewed and are negative.   Past Medical History:  Diagnosis Date   Allergy    Anemia    Arthritis    Asthma    Cancer (HCC)    colon    Chest pain    Hospital 09/2010,  /   Stress echo October 29, 2010, vigorous LV function with stress.  All walls could not be assessed fully but it was felt that there was no ischemia.   Coronary artery disease    MI per patient 2007 elsewhere, no records, pt. says cath  was [facility]   Diverticulitis    Elevated CPK    Hospital 09/2010,  troponin normal   GERD (gastroesophageal reflux disease)    Hyperlipidemia    Hypertension    Kidney stone    MI (myocardial infarction) (HCC)    2007      Objective:  Today's Vitals   12/07/22 0853 12/07/22 0856  BP: (!) 148/84 (!) 140/80  Temp: 98 F (36.7 C)   TempSrc: Oral    There is no height or weight on file to calculate BMI.   Physical Exam Exam conducted with a chaperone present.  Constitutional:      Appearance: Normal appearance. She is obese.  Pulmonary:     Effort: Pulmonary effort is normal.  Abdominal:     General: Bowel sounds are normal.     Palpations: Abdomen is soft.  Genitourinary:    General: Normal vulva.     Uterus: Absent.      Adnexa: Left adnexa normal.       Right: Tenderness present. No mass or fullness.         Left: No mass, tenderness or fullness.    Neurological:     Mental Status: She is alert.  Psychiatric:        Mood and Affect: Mood normal.        Thought Content: Thought content normal.        Judgment: Judgment normal.      Raynelle Fanning, CMA present for exam  Assessment:/Plan:   1. Pelvic pain Likely r/t diverticulitis after eating nuts Follow  up with GI Reassure normal GYN exam - Urinalysis,Complete w/RFL Culture   Arlie Solomons, [facility]

## 2022-12-29 ENCOUNTER — Encounter: Payer: Self-pay | Admitting: Nurse Practitioner

## 2022-12-29 ENCOUNTER — Ambulatory Visit (INDEPENDENT_AMBULATORY_CARE_PROVIDER_SITE_OTHER): Payer: No Typology Code available for payment source | Admitting: Nurse Practitioner

## 2022-12-29 VITALS — BP 124/76 | HR 88 | Ht 63.0 in | Wt 204.0 lb

## 2022-12-29 DIAGNOSIS — Z780 Asymptomatic menopausal state: Secondary | ICD-10-CM

## 2022-12-29 DIAGNOSIS — Z01419 Encounter for gynecological examination (general) (routine) without abnormal findings: Secondary | ICD-10-CM

## 2022-12-29 NOTE — Progress Notes (Signed)
   Angela Patton [birth year] [id number]   History:  72 y.o. G3P3 presents for breast and pelvic exam without GYN complaints. Postmenopausal - no HRT. S/P hysterectomy for fibroids. H/O colon cancer.   Gynecologic History No LMP recorded. Patient has had a hysterectomy.   Contraception: status post hysterectomy Sexually active: No  Health Maintenance Last Pap: 04/15/2020. Results were: Normal Last mammogram: 10/26/2021. Results were: Normal Last colonoscopy: 04/09/2022. Results were: Normal, 5-year recall Last Dexa: 03/31/2017. Results were: Normal  Past medical history, past surgical history, family history and social history were all reviewed and documented in the EPIC chart. Married. Husband is bedridden d/t MS. One son in [place] with 4 children, one daughter here with 57 yo daughter, and one son here with 2 children.   ROS:  A ROS was performed and pertinent positives and negatives are included.  Exam:  Vitals:   12/29/22 1353  BP: 124/76  Pulse: 88  SpO2: 98%  Weight: 204 lb (92.5 kg)  Height: 5\' 3"  (1.6 m)   Body mass index is 36.14 kg/m.  General appearance:  Normal Thyroid:  Symmetrical, normal in size, without palpable masses or nodularity. Respiratory  Auscultation:  Clear without wheezing or rhonchi Cardiovascular  Auscultation:  Regular rate, without rubs, murmurs or gallops  Edema/varicosities:  Not grossly evident Abdominal  Soft,nontender, without masses, guarding or rebound.  Liver/spleen:  No organomegaly noted  Hernia:  None appreciated  Skin  Inspection:  Rash on bilateral groin c/w yeast (being treated topically) Breasts: Examined lying and sitting.   Right: Without masses, retractions, nipple discharge or axillary adenopathy.   Left: Without masses, retractions, nipple discharge or axillary adenopathy. Genitourinary   Inguinal/mons:  Normal without inguinal adenopathy  External genitalia:  Normal appearing vulva with no masses, tenderness, or  lesions  BUS/Urethra/Skene's glands:  Normal  Vagina:  Normal appearing with normal color and discharge, no lesions  Cervix:  And uterus absent  Adnexa/parametria:     Rt: Normal in size, without masses or tenderness.   Lt: Normal in size, without masses or tenderness.  Anus and perineum: Normal  Digital rectal exam: Deferred  Patient informed chaperone available to be present for breast and pelvic exam. Patient has requested no chaperone to be present. Patient has been advised what will be completed during breast and pelvic exam.   Assessment/Plan:  72 y.o. G3P3 for breast and pelvic exam.   Encounter for breast and pelvic examination - Education provided on SBEs, importance of preventative screenings, current guidelines, high calcium diet, regular exercise, and multivitamin daily.  Labs with PCP.   Postmenopausal - Plan: DG Bone Density. No HRT. S/P hysterectomy for fibroids.   Screening for cervical cancer - Normal Pap history. No longer screening per guidelines.   Screening for breast cancer - Normal mammogram history. Overdue and plans to schedule. Normal breast exam today.  Screening for colon cancer - 03/2022 colonoscopy. Will repeat at 5-year interval per GI's recommendation. H/O colon cancer.   Return in 2 years for breast and pelvic exam.     Olivia Mackie DNP, 2:21 PM 12/29/2022

## 2023-01-06 DIAGNOSIS — Z008 Encounter for other general examination: Secondary | ICD-10-CM | POA: Diagnosis not present

## 2023-01-06 DIAGNOSIS — J4520 Mild intermittent asthma, uncomplicated: Secondary | ICD-10-CM | POA: Diagnosis not present

## 2023-01-06 DIAGNOSIS — R2681 Unsteadiness on feet: Secondary | ICD-10-CM | POA: Diagnosis not present

## 2023-01-06 DIAGNOSIS — Z6836 Body mass index (BMI) 36.0-36.9, adult: Secondary | ICD-10-CM | POA: Diagnosis not present

## 2023-01-06 DIAGNOSIS — E785 Hyperlipidemia, unspecified: Secondary | ICD-10-CM | POA: Diagnosis not present

## 2023-01-06 DIAGNOSIS — R002 Palpitations: Secondary | ICD-10-CM | POA: Diagnosis not present

## 2023-01-06 DIAGNOSIS — I2510 Atherosclerotic heart disease of native coronary artery without angina pectoris: Secondary | ICD-10-CM | POA: Diagnosis not present

## 2023-02-17 ENCOUNTER — Ambulatory Visit: Payer: No Typology Code available for payment source | Admitting: Gastroenterology

## 2023-02-17 ENCOUNTER — Other Ambulatory Visit: Payer: No Typology Code available for payment source

## 2023-02-17 ENCOUNTER — Encounter: Payer: Self-pay | Admitting: Gastroenterology

## 2023-02-17 VITALS — BP 130/80 | HR 68 | Ht 63.0 in | Wt 205.0 lb

## 2023-02-17 DIAGNOSIS — Z85038 Personal history of other malignant neoplasm of large intestine: Secondary | ICD-10-CM

## 2023-02-17 DIAGNOSIS — R109 Unspecified abdominal pain: Secondary | ICD-10-CM | POA: Diagnosis not present

## 2023-02-17 DIAGNOSIS — R1032 Left lower quadrant pain: Secondary | ICD-10-CM | POA: Diagnosis not present

## 2023-02-17 LAB — COMPREHENSIVE METABOLIC PANEL
ALT: 29 U/L (ref 0–35)
AST: 39 U/L — ABNORMAL HIGH (ref 0–37)
Albumin: 4 g/dL (ref 3.5–5.2)
Alkaline Phosphatase: 88 U/L (ref 39–117)
BUN: 26 mg/dL — ABNORMAL HIGH (ref 6–23)
CO2: 29 meq/L (ref 19–32)
Calcium: 10.9 mg/dL — ABNORMAL HIGH (ref 8.4–10.5)
Chloride: 104 meq/L (ref 96–112)
Creatinine, Ser: 1.31 mg/dL — ABNORMAL HIGH (ref 0.40–1.20)
GFR: 40.45 mL/min — ABNORMAL LOW (ref >60.00)
Glucose, Bld: 89 mg/dL (ref 70–99)
Potassium: 4.5 meq/L (ref 3.5–5.1)
Sodium: 137 meq/L (ref 135–145)
Total Bilirubin: 0.5 mg/dL (ref 0.2–1.2)
Total Protein: 7.9 g/dL (ref 6.0–8.3)

## 2023-02-17 LAB — CBC WITH DIFFERENTIAL/PLATELET
Basophils Absolute: 0 10*3/uL (ref 0.0–0.1)
Basophils Relative: 0.6 % (ref 0.0–3.0)
Eosinophils Absolute: 0.3 10*3/uL (ref 0.0–0.7)
Eosinophils Relative: 5.9 % — ABNORMAL HIGH (ref 0.0–5.0)
HCT: 34.1 % — ABNORMAL LOW (ref 36.0–46.0)
Hemoglobin: 10.9 g/dL — ABNORMAL LOW (ref 12.0–15.0)
Lymphocytes Relative: 49.7 % — ABNORMAL HIGH (ref 12.0–46.0)
Lymphs Abs: 2.2 10*3/uL (ref 0.7–4.0)
MCHC: 32.1 g/dL (ref 30.0–36.0)
MCV: 90.7 fL (ref 78.0–100.0)
Monocytes Absolute: 0.4 10*3/uL (ref 0.1–1.0)
Monocytes Relative: 9.5 % (ref 3.0–12.0)
Neutro Abs: 1.5 10*3/uL (ref 1.4–7.7)
Neutrophils Relative %: 34.3 % — ABNORMAL LOW (ref 43.0–77.0)
Platelets: 211 10*3/uL (ref 150.0–400.0)
RBC: 3.76 Mil/uL — ABNORMAL LOW (ref 3.87–5.11)
RDW: 14.7 % (ref 11.5–15.5)
WBC: 4.4 10*3/uL (ref 4.0–10.5)

## 2023-02-17 NOTE — Patient Instructions (Signed)
Your provider has requested that you go to the basement level for lab work before leaving today. Press "B" on the elevator. The lab is located at the first door on the left as you exit the elevator.  You have been scheduled for a CT scan of the abdomen and pelvis at [facility], 1st floor Radiology. You are scheduled on Tuesday 02/22/23 at 3 pm. Please arrive at 12:45 pm for registration and to drink oral contrast.     Please follow the written instructions below on the day of your exam:   1) Do not eat anything after 11 am (4 hours prior to your test)    You may take any medications as prescribed with a small amount of water, if necessary. If you take any of the following medications: METFORMIN, GLUCOPHAGE, GLUCOVANCE, AVANDAMET, RIOMET, FORTAMET, ACTOPLUS MET, JANUMET, GLUMETZA or METAGLIP, you MAY be asked to HOLD this medication 48 hours AFTER the exam.   The purpose of you drinking the oral contrast is to aid in the visualization of your intestinal tract. The contrast solution may cause some diarrhea. Depending on your individual set of symptoms, you may also receive an intravenous injection of x-ray contrast/dye. Plan on being at [facility] for 45 minutes or longer, depending on the type of exam you are having performed.   If you have any questions regarding your exam or if you need to reschedule, you may call [facility] at [phone] between the hours of 8:00 am and 5:00 pm, Monday-Friday.

## 2023-02-17 NOTE — Progress Notes (Signed)
02/17/2023 Angela Patton [id number] Jun 01, 1950   HISTORY OF PRESENT ILLNESS: This is a 72 year old female who is a patient of Dr. Irving Burton.  She has personal history of colon cancer in about 2004 that was treated in [place], [place]. She has not been seen in the office since October 2018, but had her colonoscopy in December 2023 that showed a healthy anastomosis, no other issues.  She is here today with complaints of left-sided abdominal pain.  She says that the pain is always on the left side.  Has been bothering her for about the past month.  It comes and goes and is not hard or strong pain.  Not wake her up from sleep at night.  Says she is moving her bowels fine without any issues.  Does not appear that she has seen her PCP or had any lab work or anything recently.   Past Medical History:  Diagnosis Date   Allergy    Anemia    Arthritis    Asthma    Cancer (HCC)    colon    Chest pain    Hospital 09/2010,  /   Stress echo October 29, 2010, vigorous LV function with stress.  All walls could not be assessed fully but it was felt that there was no ischemia.   Coronary artery disease    MI per patient 2007 elsewhere, no records, pt. says cath  was [facility]   Diverticulitis    Elevated CPK    Hospital 09/2010,  troponin normal   GERD (gastroesophageal reflux disease)    Hyperlipidemia    Hypertension    Kidney stone    MI (myocardial infarction) (HCC)    2007   Past Surgical History:  Procedure Laterality Date   ABDOMINAL HYSTERECTOMY     CESAREAN SECTION     x 2   COLON SURGERY     2004   FOOT SURGERY     left 1999   HERNIA REPAIR     three total, 2007   SHOULDER SURGERY     left shoulder 2008   TOTAL HIP ARTHROPLASTY     right hip 2007    reports that she has never smoked. She has never used smokeless tobacco. She reports current alcohol use. She reports that she does not use drugs. family history includes Bone cancer in her mother; Colon cancer in her father;  Diabetes in her sister, sister, and sister; Hodgkin's lymphoma in her brother; Stomach cancer in her brother. Allergies  Allergen Reactions   Penicillins Hives and Swelling    Has patient had a PCN reaction causing immediate rash, facial/tongue/throat swelling, SOB or lightheadedness with hypotension:  Has patient had a PCN reaction causing severe rash involving mucus membranes or skin necrosis:  Has patient had a PCN reaction that required hospitalization:  Has patient had a PCN reaction occurring within the last 10 years:  If all of the above answers are "NO", then may proceed with Cephalosporin use.       Outpatient Encounter Medications as of 02/17/2023  Medication Sig   albuterol (PROVENTIL HFA;VENTOLIN HFA) 108 (90 BASE) MCG/ACT inhaler Inhale 1-2 puffs into the lungs every 6 (six) hours as needed for wheezing or shortness of breath.   Ibuprofen (ADVIL) 200 MG CAPS Take by mouth.   No facility-administered encounter medications on file as of 02/17/2023.    REVIEW OF SYSTEMS  : All other systems reviewed and negative except where noted in the History  of Present Illness.   PHYSICAL EXAM: BP 130/80   Pulse 68   Ht 5\' 3"  (1.6 m)   Wt 205 lb (93 kg)   BMI 36.31 kg/m  General: Well developed female in no acute distress Head: Normocephalic and atraumatic Eyes:  Sclerae anicteric, conjunctiva pink. Ears: Normal auditory acuity Lungs: Clear throughout to auscultation; no W/R/R. Heart: Regular rate and rhythm; no M/R/G. Abdomen: Soft, non-distended.  BS present.  Non-tender. Musculoskeletal: Symmetrical with no gross deformities  Skin: No lesions on visible extremities Extremities: No edema  Neurological: Alert oriented x 4, grossly non-focal Psychological:  Alert and cooperative. Normal mood and affect  ASSESSMENT AND PLAN: 72 year old female with history of colon cancer.  Colonoscopy is up-to-date from December 2023 and looked good.  Having intermittent left-sided abdominal  pain for the past month.  Will plan for CT scan of the abdomen and pelvis with contrast.  Question of this could be due to adhesions versus intestinal spasm/cramping.  Could consider trial of Bentyl or Levsin.  Will check a CBC and CMP today as well.   CC:  Georgina Quint, [place]

## 2023-02-18 NOTE — Progress Notes (Signed)
____________________________________________________________  Attending physician addendum:  Thank you for sending this case to me. I have reviewed the entire note and agree with the plan.   Malyssa Maris Danis, MD  ____________________________________________________________  

## 2023-02-22 ENCOUNTER — Other Ambulatory Visit (HOSPITAL_COMMUNITY): Payer: No Typology Code available for payment source

## 2023-02-23 ENCOUNTER — Ambulatory Visit (HOSPITAL_COMMUNITY)
Admission: RE | Admit: 2023-02-23 | Discharge: 2023-02-23 | Disposition: A | Discharge status: Home / Self Care | Payer: No Typology Code available for payment source | Source: Ambulatory Visit | Attending: Gastroenterology | Admitting: Gastroenterology

## 2023-02-23 DIAGNOSIS — R109 Unspecified abdominal pain: Secondary | ICD-10-CM | POA: Insufficient documentation

## 2023-02-23 DIAGNOSIS — R102 Pelvic and perineal pain: Secondary | ICD-10-CM | POA: Diagnosis not present

## 2023-02-23 DIAGNOSIS — R1032 Left lower quadrant pain: Secondary | ICD-10-CM | POA: Insufficient documentation

## 2023-02-23 DIAGNOSIS — Z85038 Personal history of other malignant neoplasm of large intestine: Secondary | ICD-10-CM | POA: Insufficient documentation

## 2023-02-23 MED ORDER — IOHEXOL 300 MG/ML  SOLN: 100.0000 mL | Freq: Once | INTRAMUSCULAR | Status: DC | PRN

## 2023-02-23 MED ORDER — IOHEXOL 9 MG/ML PO SOLN
1000.0000 mL | ORAL | Status: AC
  Administered 2023-02-23: 1000 mL via ORAL

## 2023-02-23 MED ORDER — IOHEXOL 300 MG/ML  SOLN
80.0000 mL | Freq: Once | INTRAMUSCULAR | Status: AC | PRN
  Administered 2023-02-23: 80 mL via INTRAVENOUS

## 2023-02-23 MED ORDER — IOHEXOL 9 MG/ML PO SOLN
ORAL | Status: AC
Start: 2023-02-23 — End: ?
  Filled 2023-02-23: qty 1000

## 2023-03-16 ENCOUNTER — Telehealth: Payer: Self-pay | Admitting: Gastroenterology

## 2023-03-16 DIAGNOSIS — D649 Anemia, unspecified: Secondary | ICD-10-CM

## 2023-03-16 NOTE — Telephone Encounter (Signed)
Leta Baptist, PA-C 03/02/2023  1:26 PM EST     Please let her know that her CT scan did not show any cause of her abdominal pain.  Nothing concerning seen at all.  Her pain could be spasm or cramping.  If she still experiencing pain we could try some Bentyl or Levsin.  On her blood work that was performed prior to her CT scan her kidney function was slightly worse than what it had been previously, but that was compared to 6 years ago.  Please have her follow-up in regards to that with her PCP as they may want to recheck that/monitor that.  Her hemoglobin was also very slightly low.  Could consider checking iron studies, B12 levels, and folate if she is willing to have repeat labs done for [place].

## 2023-03-16 NOTE — Telephone Encounter (Signed)
I have spoken to patient to advise of normal CT and have explained that abdominal pain may be related to spasm or cramps. Offered an Investment banker, operational. Patient states that she no longer thinks she needs medication for this. In addition, patient is advised that she should follow with her PCP regarding decreased kidney function as she may need to be monitored for acute changes. She is advised of low hemoglobin and is asked to return for additional labwork. Patient states that she will come for this.   Of note: she does states that she has previously been intolerant to oral iron supplements.

## 2023-03-16 NOTE — Telephone Encounter (Signed)
Patient is returning your call.  

## 2023-03-16 NOTE — Telephone Encounter (Signed)
Left message for patient to call back (see imaging result notes from 02/23/23 for details).

## 2023-03-30 DIAGNOSIS — Z0100 Encounter for examination of eyes and vision without abnormal findings: Secondary | ICD-10-CM | POA: Diagnosis not present

## 2023-03-30 DIAGNOSIS — H43393 Other vitreous opacities, bilateral: Secondary | ICD-10-CM | POA: Diagnosis not present

## 2023-03-31 DIAGNOSIS — R052 Subacute cough: Secondary | ICD-10-CM | POA: Diagnosis not present

## 2023-05-06 ENCOUNTER — Ambulatory Visit (INDEPENDENT_AMBULATORY_CARE_PROVIDER_SITE_OTHER): Payer: Medicare Other | Admitting: Family Medicine

## 2023-05-06 ENCOUNTER — Ambulatory Visit: Payer: Medicare Other | Admitting: Family Medicine

## 2023-05-06 ENCOUNTER — Ambulatory Visit: Payer: No Typology Code available for payment source | Admitting: Internal Medicine

## 2023-05-06 ENCOUNTER — Encounter: Payer: Self-pay | Admitting: Family Medicine

## 2023-05-06 VITALS — BP 130/80 | HR 72 | Temp 98.4°F | Ht 63.0 in | Wt 208.2 lb

## 2023-05-06 DIAGNOSIS — B372 Candidiasis of skin and nail: Secondary | ICD-10-CM | POA: Diagnosis not present

## 2023-05-06 MED ORDER — NYSTATIN 100000 UNIT/GM EX POWD: 1.0000 | Freq: Three times a day (TID) | CUTANEOUS | 0 refills | Status: DC

## 2023-05-06 NOTE — Patient Instructions (Signed)
 I have sent in nystatin powder for you to use 3 times a day.  Try to keep the area clean and dry.  This should help with the itching and discomfort.  Follow-up with me for new or worsening symptoms.

## 2023-05-06 NOTE — Progress Notes (Signed)
 Acute Office Visit  Subjective:     Patient ID: Angela Patton, female    DOB: December 31, 1950, 73 y.o.   MRN: [id number]  Chief Complaint  Patient presents with   Rash    Notes of itchy rash under left breast for the past 2-3 weeks. States rash is odorous and flaky to the touch. No new changes within body care routine to prompt rash. Nothing she is aware of topically that she is allergic to    HPI Rash: Patient complains of rash involving the  skin under the L breast . Rash started 2 weeks ago. Appearance of rash at onset: Other appearance: Erythematous, shiny, itchy. Rash gotten bigger over the last 2 weeks IDiscomfort associated with rash: is pruritic.  Denies: abdominal pain, arthralgia, decrease in appetite, decrease in energy level, fever, myalgia, nausea, and vomiting. Patient has not had previous evaluation of rash. Patient has not had previous treatment. Patient has not had contacts with similar rash. Patient has not identified precipitant. Patient has not had new exposures (soaps, lotions, laundry detergents, foods, medications, plants, insects or animals.)  Has had previous rashes that are very similar in skin folds in the past.  States that they resolved with use of nystatin  powder.  ROS Per HPI      Objective:    BP 130/80   Pulse 72   Temp 98.4 F (36.9 C)   Ht 5' 3 (1.6 m)   Wt 208 lb 3.2 oz (94.4 kg)   SpO2 99%   BMI 36.88 kg/m    Physical Exam Vitals and nursing note reviewed.  Constitutional:      Appearance: Normal appearance. She is normal weight.  HENT:     Head: Normocephalic and atraumatic.     Right Ear: Tympanic membrane and ear canal normal.     Left Ear: Tympanic membrane and ear canal normal.     Nose: Nose normal.  Eyes:     Extraocular Movements: Extraocular movements intact.     Pupils: Pupils are equal, round, and reactive to light.  Cardiovascular:     Rate and Rhythm: Normal rate and regular rhythm.     Heart sounds: Normal heart  sounds.  Pulmonary:     Effort: Pulmonary effort is normal.     Breath sounds: Normal breath sounds.  Musculoskeletal:        General: Normal range of motion.     Cervical back: Normal range of motion.  Skin:    Findings: Rash present.     Comments: 3 x 6 cm area of mildly erythematous, hyperpigmented skin just under the left breast.  The area is shiny with satellite lesions.  No discharge, no bleeding, nontender.  Consistent with yeast dermatitis  Neurological:     General: No focal deficit present.     Mental Status: She is alert and oriented to person, place, and time.  Psychiatric:        Mood and Affect: Mood normal.        Thought Content: Thought content normal.     No results found for any visits on 05/06/23.      Assessment & Plan:  1. Yeast dermatitis (Primary)  - nystatin  (MYCOSTATIN /NYSTOP ) powder; Apply 1 Application topically 3 (three) times daily.  Dispense: 15 g; Refill: 0   Meds ordered this encounter  Medications   nystatin  (MYCOSTATIN /NYSTOP ) powder    Sig: Apply 1 Application topically 3 (three) times daily.    Dispense:  15 g  Refill:  0    Return if symptoms worsen or fail to improve.  Corean Ku, FNP

## 2023-05-17 ENCOUNTER — Ambulatory Visit (INDEPENDENT_AMBULATORY_CARE_PROVIDER_SITE_OTHER): Payer: Medicare Other

## 2023-05-17 VITALS — BP 150/80 | HR 88 | Ht 63.0 in | Wt 208.2 lb

## 2023-05-17 DIAGNOSIS — Z0000 Encounter for general adult medical examination without abnormal findings: Secondary | ICD-10-CM | POA: Diagnosis not present

## 2023-05-17 NOTE — Patient Instructions (Addendum)
Ms. Neidich , Thank you for taking time to come for your Medicare Wellness Visit. I appreciate your ongoing commitment to your health goals. Please review the following plan we discussed and let me know if I can assist you in the future.   Referrals/Orders/Follow-Ups/Clinician Recommendations: It was nice to meet you today.  Remember to call [facility] [place], [phone] to get schedule for your bone density screening.  This is a list of the screening recommended for you and due dates:  Health Maintenance  Topic Date Due   Hepatitis C Screening  Never done   COVID-19 Vaccine (4 - 2024-25 season) 12/26/2022   Mammogram  10/27/2023   Medicare Annual Wellness Visit  05/16/2024   DTaP/Tdap/Td vaccine (2 - Td or Tdap) 07/03/2024   Colon Cancer Screening  04/10/2027   DEXA scan (bone density measurement)  Completed   HPV Vaccine  Aged Out   Pneumonia Vaccine  Discontinued   Flu Shot  Discontinued   Zoster (Shingles) Vaccine  Discontinued    Advanced directives: (Copy Requested) Please bring a copy of your health care power of attorney and living will to the office to be added to your chart at your convenience.  Next Medicare Annual Wellness Visit scheduled for next year: Yes

## 2023-05-17 NOTE — Progress Notes (Signed)
Subjective:   Angela Patton is a 73 y.o. female who presents for Medicare Annual (Subsequent) preventive examination.  Visit Complete: In person   Cardiac Risk Factors include: advanced age (>89men, >41 women);dyslipidemia;hypertension;Other (see comment), Risk factor comments: Cancer     Objective:    Today's Vitals   05/17/23 1303  BP: (!) 150/80  Pulse: 88  SpO2: 99%  Weight: 208 lb 3.2 oz (94.4 kg)  Height: 5\' 3"  (1.6 m)   Body mass index is 36.88 kg/m.     05/17/2023    1:12 PM 04/30/2022   10:34 AM 03/04/2017    9:43 AM 02/26/2017   12:51 PM 02/08/2017   12:27 PM 04/28/2016    4:56 PM 09/10/2014    3:12 PM  Advanced Directives  Does Patient Have a Medical Advance Directive? Yes Yes No No No No;Yes No  Type of Estate agent of [place];Living will Healthcare Power of [place];Living will    Healthcare Power of [place];Living will   Copy of Healthcare Power of Attorney in Chart? No - copy requested No - copy requested    No - copy requested   Would patient like information on creating a medical advance directive?     Yes (ED - Information included in AVS)      Current Medications (verified) Outpatient Encounter Medications as of 05/17/2023  Medication Sig   albuterol (PROVENTIL HFA;VENTOLIN HFA) 108 (90 BASE) MCG/ACT inhaler Inhale 1-2 puffs into the lungs every 6 (six) hours as needed for wheezing or shortness of breath.   Ibuprofen (ADVIL) 200 MG CAPS Take by mouth.   nystatin (MYCOSTATIN/NYSTOP) powder Apply 1 Application topically 3 (three) times daily.   No facility-administered encounter medications on file as of 05/17/2023.    Allergies (verified) Penicillins   History: Past Medical History:  Diagnosis Date   Allergy    Anemia    Arthritis    Asthma    Cancer (HCC)    colon    Chest pain    Hospital 09/2010,  /   Stress echo October 29, 2010, vigorous LV function with stress.  All walls could not be assessed fully but it was felt  that there was no ischemia.   Coronary artery disease    MI per patient 2007 elsewhere, no records, pt. says cath  was [facility]   Diverticulitis    Elevated CPK    Hospital 09/2010,  troponin normal   GERD (gastroesophageal reflux disease)    Hyperlipidemia    Hypertension    Kidney stone    MI (myocardial infarction) (HCC)    2007   Past Surgical History:  Procedure Laterality Date   ABDOMINAL HYSTERECTOMY     CESAREAN SECTION     x 2   COLON SURGERY     2004   FOOT SURGERY     left 1999   HERNIA REPAIR     three total, 2007   SHOULDER SURGERY     left shoulder 2008   TOTAL HIP ARTHROPLASTY     right hip 2007   Family History  Problem Relation Age of Onset   Bone cancer Mother    Colon cancer Father    Diabetes Sister    Diabetes Sister    Diabetes Sister    Hodgkin's lymphoma Brother    Stomach cancer Brother    Esophageal cancer Neg Hx    Rectal cancer Neg Hx    Social History   Socioeconomic History   Marital  status: Married    Spouse name: Zonnie   Number of children: 3   Years of education: Not on file   Highest education level: Not on file  Occupational History   Occupation: RETIRED  Tobacco Use   Smoking status: Never   Smokeless tobacco: Never  Vaping Use   Vaping status: Never Used  Substance and Sexual Activity   Alcohol use: Yes    Comment: Rare   Drug use: No   Sexual activity: Never    Comment: 1st intercourse 73 yo-Fewer than 5 partners  Other Topics Concern   Not on file  Social History Narrative   Lives with husband and daughter's family   Social Drivers of Health   Financial Resource Strain: Low Risk  (05/17/2023)   Overall Financial Resource Strain (CARDIA)    Difficulty of Paying Living Expenses: Not hard at all  Food Insecurity: No Food Insecurity (05/17/2023)   Hunger Vital Sign    Worried About Running Out of Food in the Last Year: Never true    Ran Out of Food in the Last Year: Never true  Transportation Needs: No  Transportation Needs (05/17/2023)   PRAPARE - Administrator, Civil Service (Medical): No    Lack of Transportation (Non-Medical): No  Physical Activity: Inactive (05/17/2023)   Exercise Vital Sign    Days of Exercise per Week: 0 days    Minutes of Exercise per Session: 0 min  Stress: No Stress Concern Present (05/17/2023)   [employer] of Occupational Health - Occupational Stress Questionnaire    Feeling of Stress : Not at all  Social Connections: Socially Integrated (05/17/2023)   Social Connection and Isolation Panel [NHANES]    Frequency of Communication with Friends and Family: More than three times a week    Frequency of Social Gatherings with Friends and Family: Three times a week    Attends Religious Services: More than 4 times per year    Active Member of Clubs or Organizations: Yes    Attends Banker Meetings: Never    Marital Status: Married    Tobacco Counseling Counseling given: Not Answered   Clinical Intake:  Pre-visit preparation completed: Yes  Pain : No/denies pain     BMI - recorded: 36.88 Nutritional Status: BMI > 30  Obese Nutritional Risks: None  How often do you need to have someone help you when you read instructions, pamphlets, or other written materials from your doctor or pharmacy?: 1 - Never  Interpreter Needed?: No  Information entered by :: Brayton Baumgartner, RMA   Activities of Daily Living    05/17/2023    1:08 PM  In your present state of health, do you have any difficulty performing the following activities:  Hearing? 0  Vision? 0  Difficulty concentrating or making decisions? 0  Walking or climbing stairs? 0  Dressing or bathing? 0  Doing errands, shopping? 0  Preparing Food and eating ? N  Using the Toilet? N  In the past six months, have you accidently leaked urine? N  Do you have problems with loss of bowel control? N  Managing your Medications? N  Managing your Finances? N  Housekeeping or  managing your Housekeeping? N    Patient Care Team: Georgina Quint, MD as PCP - General (Internal Medicine) Ander Purpura, OD as Consulting Physician (Optometry)  Indicate any recent Medical Services you may have received from other than [facility] providers in the past year (date may be approximate).  Assessment:   This is a routine wellness examination for [place].  Hearing/Vision screen Hearing Screening - Comments:: Denies hearing difficulties   Vision Screening - Comments:: Wears eyeglasses   Goals Addressed             This Visit's Progress    My goal for 2024 is to live each day as I can.   On track     Depression Screen    05/17/2023    1:18 PM 04/30/2022   10:38 AM 06/03/2021   10:12 AM 10/14/2020   11:12 AM  PHQ 2/9 Scores  PHQ - 2 Score 0 0 0 0  PHQ- 9 Score 0       Fall Risk    05/17/2023    1:13 PM 04/30/2022   10:35 AM 06/03/2021   10:12 AM 10/14/2020   11:12 AM  Fall Risk   Falls in the past year? 0 0 0 1  Number falls in past yr: 0 0 0 0  Injury with Fall? 0 0 0 0  Risk for fall due to : No Fall Risks No Fall Risks  No Fall Risks  Follow up Falls prevention discussed;Falls evaluation completed Falls prevention discussed  Falls evaluation completed    MEDICARE RISK AT HOME: Medicare Risk at Home Any stairs in or around the home?: Yes (first living) If so, are there any without handrails?: Yes Home free of loose throw rugs in walkways, pet beds, electrical cords, etc?: Yes Adequate lighting in your home to reduce risk of falls?: Yes Life alert?: No Use of a cane, walker or w/c?: Yes Grab bars in the bathroom?: No Shower chair or bench in shower?: Yes Elevated toilet seat or a handicapped toilet?: No  TIMED UP AND GO:  Was the test performed?  Yes  Length of time to ambulate 10 feet: 20 sec Gait slow and steady with assistive device    Cognitive Function:        05/17/2023   12:51 PM 04/30/2022   10:35 AM  6CIT Screen  What Year? 0  points 0 points  What month? 0 points 0 points  What time? 0 points 0 points  Count back from 20 0 points 0 points  Months in reverse 0 points 0 points  Repeat phrase 0 points 0 points  Total Score 0 points 0 points    Immunizations Immunization History  Administered Date(s) Administered   PFIZER(Purple Top)SARS-COV-2 Vaccination 02/07/2020, 03/05/2020   Pfizer Covid-19 Vaccine Bivalent Booster 30yrs & up 09/03/2020   Tdap 07/04/2014    TDAP status: Up to date  Flu Vaccine status: Declined, Education has been provided regarding the importance of this vaccine but patient still declined. Advised may receive this vaccine at local pharmacy or Health Dept. Aware to provide a copy of the vaccination record if obtained from local pharmacy or Health Dept. Verbalized acceptance and understanding.  Pneumococcal vaccine status: Declined,  Education has been provided regarding the importance of this vaccine but patient still declined. Advised may receive this vaccine at local pharmacy or Health Dept. Aware to provide a copy of the vaccination record if obtained from local pharmacy or Health Dept. Verbalized acceptance and understanding.   Covid-19 vaccine status: Declined, Education has been provided regarding the importance of this vaccine but patient still declined. Advised may receive this vaccine at local pharmacy or Health Dept.or vaccine clinic. Aware to provide a copy of the vaccination record if obtained from local pharmacy or Health Dept. Verbalized acceptance and  understanding.  Qualifies for Shingles Vaccine? Yes   Zostavax completed  Declines   Shingrix Completed?: No.    Education has been provided regarding the importance of this vaccine. Patient has been advised to call insurance company to determine out of pocket expense if they have not yet received this vaccine. Advised may also receive vaccine at local pharmacy or Health Dept. Verbalized acceptance and understanding.  Screening  Tests Health Maintenance  Topic Date Due   Hepatitis C Screening  Never done   COVID-19 Vaccine (4 - 2024-25 season) 12/26/2022   MAMMOGRAM  10/27/2023   Medicare Annual Wellness (AWV)  05/16/2024   DTaP/Tdap/Td (2 - Td or Tdap) 07/03/2024   Colonoscopy  04/10/2027   DEXA SCAN  Completed   HPV VACCINES  Aged Out   Pneumonia Vaccine 100+ Years old  Discontinued   INFLUENZA VACCINE  Discontinued   Zoster Vaccines- Shingrix  Discontinued    Health Maintenance  Health Maintenance Due  Topic Date Due   Hepatitis C Screening  Never done   COVID-19 Vaccine (4 - 2024-25 season) 12/26/2022    Colorectal cancer screening: Type of screening: Colonoscopy. Completed 04/09/2022. Repeat every 5 years  Mammogram status: Completed 10/26/2021. Repeat every year  Bone Density status: Ordered 12/29/2022. Pt provided with contact info and advised to call to schedule appt.  Lung Cancer Screening: (Low Dose CT Chest recommended if Age 41-80 years, 20 pack-year currently smoking OR have quit w/in 15years.) does not qualify.   Lung Cancer Screening Referral: N/A  Additional Screening:  Hepatitis C Screening: does qualify;   Vision Screening: Recommended annual ophthalmology exams for early detection of glaucoma and other disorders of the eye. Is the patient up to date with their annual eye exam?  Yes  Who is the provider or what is the name of the office in which the patient attends annual eye exams? [facility] If pt is not established with a provider, would they like to be referred to a provider to establish care? No .   Dental Screening: Recommended annual dental exams for proper oral hygiene    Community Resource Referral / Chronic Care Management: CRR required this visit?  No   CCM required this visit?  No     Plan:     I have personally reviewed and noted the following in the patient's chart:   Medical and social history Use of alcohol, tobacco or illicit drugs  Current  medications and supplements including opioid prescriptions. Patient is not currently taking opioid prescriptions. Functional ability and status Nutritional status Physical activity Advanced directives List of other physicians Hospitalizations, surgeries, and ER visits in previous 12 months Vitals Screenings to include cognitive, depression, and falls Referrals and appointments  In addition, I have reviewed and discussed with patient certain preventive protocols, quality metrics, and best practice recommendations. A written personalized care plan for preventive services as well as general preventive health recommendations were provided to patient.     Gissell Barra L Dyan Creelman, CMA   05/17/2023   After Visit Summary: (MyChart) Due to this being a telephonic visit, the after visit summary with patients personalized plan was offered to patient via MyChart   Nurse Notes: Patient is due for Hep C screening.  She declines all vaccines.  Patient is due for a DEAX and order has been placed by [facility] [place].  Patient had no other concerns to address today.

## 2023-09-28 ENCOUNTER — Telehealth: Payer: Self-pay | Admitting: Emergency Medicine

## 2023-09-28 NOTE — Telephone Encounter (Signed)
 LVM for patient to call back or use Mychart needing better understanding on patient message

## 2023-09-28 NOTE — Telephone Encounter (Unsigned)
 Copied from CRM [id number]. Topic: Clinical - Request for Lab/Test Order >> Sep 27, 2023  4:46 PM Crispin Dolphin wrote: Reason for CRM: Patient called. Was reviewing after visit summary. Says he has mammogram 7/3. Advised not scheduled or ordered but showing as due date. She would like to go ahead and have it scheduled. Thank You

## 2023-09-30 NOTE — Telephone Encounter (Signed)
 Spoke with patient and clarified that she does not have an appointment scheduled. What she mistaken was the suggested recommendations that need to done. She understood, and no further questions

## 2023-12-13 ENCOUNTER — Ambulatory Visit (INDEPENDENT_AMBULATORY_CARE_PROVIDER_SITE_OTHER): Admitting: Emergency Medicine

## 2023-12-13 ENCOUNTER — Ambulatory Visit (INDEPENDENT_AMBULATORY_CARE_PROVIDER_SITE_OTHER)

## 2023-12-13 ENCOUNTER — Encounter: Payer: Self-pay | Admitting: Emergency Medicine

## 2023-12-13 ENCOUNTER — Ambulatory Visit: Payer: Self-pay | Admitting: Emergency Medicine

## 2023-12-13 VITALS — BP 134/82 | HR 79 | Temp 98.1°F | Ht 63.0 in | Wt 202.0 lb

## 2023-12-13 DIAGNOSIS — R2242 Localized swelling, mass and lump, left lower limb: Secondary | ICD-10-CM

## 2023-12-13 DIAGNOSIS — I10 Essential (primary) hypertension: Secondary | ICD-10-CM | POA: Diagnosis not present

## 2023-12-13 DIAGNOSIS — C801 Malignant (primary) neoplasm, unspecified: Secondary | ICD-10-CM | POA: Diagnosis not present

## 2023-12-13 DIAGNOSIS — M1612 Unilateral primary osteoarthritis, left hip: Secondary | ICD-10-CM | POA: Insufficient documentation

## 2023-12-13 DIAGNOSIS — N1832 Chronic kidney disease, stage 3b: Secondary | ICD-10-CM | POA: Insufficient documentation

## 2023-12-13 LAB — CBC WITH DIFFERENTIAL/PLATELET
Basophils Absolute: 0 K/uL (ref 0.0–0.1)
Basophils Relative: 0.2 % (ref 0.0–3.0)
Eosinophils Absolute: 0.2 K/uL (ref 0.0–0.7)
Eosinophils Relative: 4.3 % (ref 0.0–5.0)
HCT: 33.7 % — ABNORMAL LOW (ref 36.0–46.0)
Hemoglobin: 11 g/dL — ABNORMAL LOW (ref 12.0–15.0)
Lymphocytes Relative: 57.8 % — ABNORMAL HIGH (ref 12.0–46.0)
Lymphs Abs: 3.3 K/uL (ref 0.7–4.0)
MCHC: 32.7 g/dL (ref 30.0–36.0)
MCV: 88.9 fl (ref 78.0–100.0)
Monocytes Absolute: 0.5 K/uL (ref 0.1–1.0)
Monocytes Relative: 8.6 % (ref 3.0–12.0)
Neutro Abs: 1.7 K/uL (ref 1.4–7.7)
Neutrophils Relative %: 29.1 % — ABNORMAL LOW (ref 43.0–77.0)
Platelets: 227 K/uL (ref 150.0–400.0)
RBC: 3.79 Mil/uL — ABNORMAL LOW (ref 3.87–5.11)
RDW: 14.2 % (ref 11.5–15.5)
WBC: 5.8 K/uL (ref 4.0–10.5)

## 2023-12-13 LAB — LIPID PANEL
Cholesterol: 221 mg/dL — ABNORMAL HIGH (ref 0–200)
HDL: 54.3 mg/dL (ref >39.00)
LDL Cholesterol: 142 mg/dL — ABNORMAL HIGH (ref 0–99)
NonHDL: 166.4
Total CHOL/HDL Ratio: 4
Triglycerides: 123 mg/dL (ref 0.0–149.0)
VLDL: 24.6 mg/dL (ref 0.0–40.0)

## 2023-12-13 LAB — COMPREHENSIVE METABOLIC PANEL WITH GFR
ALT: 22 U/L (ref 0–35)
AST: 31 U/L (ref 0–37)
Albumin: 4.2 g/dL (ref 3.5–5.2)
Alkaline Phosphatase: 97 U/L (ref 39–117)
BUN: 22 mg/dL (ref 6–23)
CO2: 27 meq/L (ref 19–32)
Calcium: 11.1 mg/dL — ABNORMAL HIGH (ref 8.4–10.5)
Chloride: 103 meq/L (ref 96–112)
Creatinine, Ser: 1.41 mg/dL — ABNORMAL HIGH (ref 0.40–1.20)
GFR: 36.82 mL/min — ABNORMAL LOW
Glucose, Bld: 88 mg/dL (ref 70–99)
Potassium: 4 meq/L (ref 3.5–5.1)
Sodium: 139 meq/L (ref 135–145)
Total Bilirubin: 0.5 mg/dL (ref 0.2–1.2)
Total Protein: 7.7 g/dL (ref 6.0–8.3)

## 2023-12-13 LAB — HEMOGLOBIN A1C: Hgb A1c MFr Bld: 6.1 % (ref 4.6–6.5)

## 2023-12-13 NOTE — Assessment & Plan Note (Addendum)
 BP Readings from Last 3 Encounters:  12/13/23 134/82  05/17/23 (!) 150/80  05/06/23 130/80  Well-controlled hypertension Off medication

## 2023-12-13 NOTE — Assessment & Plan Note (Signed)
 Diet and nutrition discussed.  Advised to decrease amount of daily carbohydrate intake and daily calories and increase amount of plant-based protein in her diet.

## 2023-12-13 NOTE — Assessment & Plan Note (Signed)
 Clinically stable.  History of colon cancer Differential diagnosis discussed Recommend x-ray today Soft tissue ultrasound Blood work today Follow-up after diagnostic workup is done Pain management discussed Recommend Tylenol  and or Advil  as needed for pain.

## 2023-12-13 NOTE — Assessment & Plan Note (Signed)
 History of colon cancer. Recent CT scan of abdomen and pelvis shows no recurrence

## 2023-12-13 NOTE — Patient Instructions (Signed)
 Health Maintenance After Age 74 After age 27, you are at a higher risk for certain long-term diseases and infections as well as injuries from falls. Falls are a major cause of broken bones and head injuries in people who are older than age 73. Getting regular preventive care can help to keep you healthy and well. Preventive care includes getting regular testing and making lifestyle changes as recommended by your health care provider. Talk with your health care provider about: Which screenings and tests you should have. A screening is a test that checks for a disease when you have no symptoms. A diet and exercise plan that is right for you. What should I know about screenings and tests to prevent falls? Screening and testing are the best ways to find a health problem early. Early diagnosis and treatment give you the best chance of managing medical conditions that are common after age 90. Certain conditions and lifestyle choices may make you more likely to have a fall. Your health care provider may recommend: Regular vision checks. Poor vision and conditions such as cataracts can make you more likely to have a fall. If you wear glasses, make sure to get your prescription updated if your vision changes. Medicine review. Work with your health care provider to regularly review all of the medicines you are taking, including over-the-counter medicines. Ask your health care provider about any side effects that may make you more likely to have a fall. Tell your health care provider if any medicines that you take make you feel dizzy or sleepy. Strength and balance checks. Your health care provider may recommend certain tests to check your strength and balance while standing, walking, or changing positions. Foot health exam. Foot pain and numbness, as well as not wearing proper footwear, can make you more likely to have a fall. Screenings, including: Osteoporosis screening. Osteoporosis is a condition that causes  the bones to get weaker and break more easily. Blood pressure screening. Blood pressure changes and medicines to control blood pressure can make you feel dizzy. Depression screening. You may be more likely to have a fall if you have a fear of falling, feel depressed, or feel unable to do activities that you used to do. Alcohol  use screening. Using too much alcohol  can affect your balance and may make you more likely to have a fall. Follow these instructions at home: Lifestyle Do not drink alcohol  if: Your health care provider tells you not to drink. If you drink alcohol : Limit how much you have to: 0-1 drink a day for women. 0-2 drinks a day for men. Know how much alcohol  is in your drink. In the [place] one drink equals one 12 oz bottle of beer (355 mL), one 5 oz glass of wine (148 mL), or one 1 oz glass of hard liquor (44 mL). Do not use any products that contain nicotine or tobacco. These products include cigarettes, chewing tobacco, and vaping devices, such as e-cigarettes. If you need help quitting, ask your health care provider. Activity  Follow a regular exercise program to stay fit. This will help you maintain your balance. Ask your health care provider what types of exercise are appropriate for you. If you need a cane or walker, use it as recommended by your health care provider. Wear supportive shoes that have nonskid soles. Safety  Remove any tripping hazards, such as rugs, cords, and clutter. Install safety equipment such as grab bars in bathrooms and safety rails on stairs. Keep rooms and walkways  well-lit. General instructions Talk with your health care provider about your risks for falling. Tell your health care provider if: You fall. Be sure to tell your health care provider about all falls, even ones that seem minor. You feel dizzy, tiredness (fatigue), or off-balance. Take over-the-counter and prescription medicines only as told by your health care provider. These include  supplements. Eat a healthy diet and maintain a healthy weight. A healthy diet includes low-fat dairy products, low-fat (lean) meats, and fiber from whole grains, beans, and lots of fruits and vegetables. Stay current with your vaccines. Schedule regular health, dental, and eye exams. Summary Having a healthy lifestyle and getting preventive care can help to protect your health and wellness after age 15. Screening and testing are the best way to find a health problem early and help you avoid having a fall. Early diagnosis and treatment give you the best chance for managing medical conditions that are more common for people who are older than age 42. Falls are a major cause of broken bones and head injuries in people who are older than age 64. Take precautions to prevent a fall at home. Work with your health care provider to learn what changes you can make to improve your health and wellness and to prevent falls. This information is not intended to replace advice given to you by your health care provider. Make sure you discuss any questions you have with your health care provider. Document Revised: 09/01/2020 Document Reviewed: 09/01/2020 Elsevier Patient Education  2024 [employer].

## 2023-12-13 NOTE — Progress Notes (Signed)
 Angela Patton 73 y.o.   Chief Complaint  Patient presents with   Mass    Patient here for mass on her left thigh. Patient states noticing it 2 days ago. She says it warm to the touch, no redness, pain only when she lifts her left leg. Patient is not taking for the pain    HISTORY OF PRESENT ILLNESS: This is a 73 y.o. female complaining of mass to left hip area that she noticed about 3 days ago.  Denies injury.  Denies associated symptoms. No other complaints or medical concerns today.  HPI   Prior to Admission medications   Medication Sig Start Date End Date Taking? Authorizing Provider  albuterol  (PROVENTIL  HFA;VENTOLIN  HFA) 108 (90 BASE) MCG/ACT inhaler Inhale 1-2 puffs into the lungs every 6 (six) hours as needed for wheezing or shortness of breath. 09/10/14  Yes Patt Alm Macho, MD  Ibuprofen  (ADVIL ) 200 MG CAPS Take by mouth.   Yes [provider]  nystatin  (MYCOSTATIN /NYSTOP ) powder Apply 1 Application topically 3 (three) times daily. Patient not taking: Reported on 12/13/2023 05/06/23   Alvia Corean CROME, FNP    Allergies  Allergen Reactions   Penicillins Hives and Swelling    Has patient had a PCN reaction causing immediate rash, facial/tongue/throat swelling, SOB or lightheadedness with hypotension:  Has patient had a PCN reaction causing severe rash involving mucus membranes or skin necrosis:  Has patient had a PCN reaction that required hospitalization:  Has patient had a PCN reaction occurring within the last 10 years:  If all of the above answers are NO, then may proceed with Cephalosporin use.     Patient Active Problem List   Diagnosis Date Noted   Left lower quadrant abdominal pain 02/09/2017   Personal history of colon cancer 02/09/2017   Hypertension    Coronary artery disease    Cancer (HCC)    Hyperlipidemia     Past Medical History:  Diagnosis Date   Allergy    Anemia    Arthritis    Asthma    Cancer (HCC)    colon    Chest  pain    Hospital 09/2010,  /   Stress echo October 29, 2010, vigorous LV function with stress.  All walls could not be assessed fully but it was felt that there was no ischemia.   Coronary artery disease    MI per patient 2007 elsewhere, no records, pt. says cath  was [facility]   Diverticulitis    Elevated CPK    Hospital 09/2010,  troponin normal   GERD (gastroesophageal reflux disease)    Hyperlipidemia    Hypertension    Kidney stone    MI (myocardial infarction) (HCC)    2007    Past Surgical History:  Procedure Laterality Date   ABDOMINAL HYSTERECTOMY     CESAREAN SECTION     x 2   COLON SURGERY     2004   FOOT SURGERY     left 1999   HERNIA REPAIR     three total, 2007   SHOULDER SURGERY     left shoulder 2008   TOTAL HIP ARTHROPLASTY     right hip 2007    Social History   Socioeconomic History   Marital status: Married    Spouse name: Zonnie   Number of children: 3   Years of education: Not on file   Highest education level: Not on file  Occupational History   Occupation: RETIRED  Tobacco Use  Smoking status: Never   Smokeless tobacco: Never  Vaping Use   Vaping status: Never Used  Substance and Sexual Activity   Alcohol use: Yes    Comment: Rare   Drug use: No   Sexual activity: Never    Comment: 1st intercourse 73 yo-Fewer than 5 partners  Other Topics Concern   Not on file  Social History Narrative   Lives with husband and daughter's family   Social Drivers of Health   Financial Resource Strain: Low Risk  (05/17/2023)   Overall Financial Resource Strain (CARDIA)    Difficulty of Paying Living Expenses: Not hard at all  Food Insecurity: No Food Insecurity (05/17/2023)   Hunger Vital Sign    Worried About Running Out of Food in the Last Year: Never true    Ran Out of Food in the Last Year: Never true  Transportation Needs: No Transportation Needs (05/17/2023)   PRAPARE - Administrator, Civil Service (Medical): No    Lack of Transportation  (Non-Medical): No  Physical Activity: Inactive (05/17/2023)   Exercise Vital Sign    Days of Exercise per Week: 0 days    Minutes of Exercise per Session: 0 min  Stress: No Stress Concern Present (05/17/2023)   [employer] of Occupational Health - Occupational Stress Questionnaire    Feeling of Stress : Not at all  Social Connections: Socially Integrated (05/17/2023)   Social Connection and Isolation Panel    Frequency of Communication with Friends and Family: More than three times a week    Frequency of Social Gatherings with Friends and Family: Three times a week    Attends Religious Services: More than 4 times per year    Active Member of Clubs or Organizations: Yes    Attends Banker Meetings: Never    Marital Status: Married  Catering manager Violence: Not At Risk (05/17/2023)   Humiliation, Afraid, Rape, and Kick questionnaire    Fear of Current or Ex-Partner: No    Emotionally Abused: No    Physically Abused: No    Sexually Abused: No    Family History  Problem Relation Age of Onset   Bone cancer Mother    Colon cancer Father    Diabetes Sister    Diabetes Sister    Diabetes Sister    Hodgkin's lymphoma Brother    Stomach cancer Brother    Esophageal cancer Neg Hx    Rectal cancer Neg Hx      Review of Systems  Constitutional: Negative.  Negative for chills and fever.  HENT: Negative.  Negative for congestion and sore throat.   Respiratory: Negative.  Negative for cough and shortness of breath.   Cardiovascular: Negative.  Negative for chest pain and palpitations.  Gastrointestinal:  Negative for abdominal pain, diarrhea, nausea and vomiting.  Genitourinary: Negative.  Negative for dysuria and hematuria.  Skin: Negative.  Negative for rash.  Neurological: Negative.  Negative for dizziness and headaches.  All other systems reviewed and are negative.   Vitals:   12/13/23 1011  BP: 134/82  Pulse: 79  Temp: 98.1 F (36.7 C)  SpO2: 98%     Physical Exam Vitals reviewed.  Constitutional:      Appearance: Normal appearance.  HENT:     Head: Normocephalic.     Mouth/Throat:     Mouth: Mucous membranes are moist.     Pharynx: Oropharynx is clear.  Eyes:     Extraocular Movements: Extraocular movements intact.  Pupils: Pupils are equal, round, and reactive to light.  Cardiovascular:     Rate and Rhythm: Normal rate and regular rhythm.     Pulses: Normal pulses.     Heart sounds: Normal heart sounds.  Pulmonary:     Effort: Pulmonary effort is normal.     Breath sounds: Normal breath sounds.  Musculoskeletal:     Cervical back: No tenderness.  Lymphadenopathy:     Cervical: No cervical adenopathy.  Skin:    General: Skin is warm and dry.     Capillary Refill: Capillary refill takes less than 2 seconds.     Comments: Soft tissue nontender mass to left hip area  Neurological:     General: No focal deficit present.     Mental Status: She is alert and oriented to person, place, and time.  Psychiatric:        Mood and Affect: Mood normal.        Behavior: Behavior normal.   DG HIP UNILAT W OR W/O PELVIS 2-3 VIEWS LEFT Result Date: 12/13/2023 CLINICAL DATA:  Left hip mass EXAM: DG HIP (WITH OR WITHOUT PELVIS) 2-3V LEFT COMPARISON:  None Available. FINDINGS: Right hip arthroplasty is anatomically aligned without dislocation. 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 No soft tissue mass appreciated. IMPRESSION: 1. No acute fracture, pelvic bone diastasis, or dislocation. No soft tissue mass appreciated. If concern persists for a left hip mass, a pelvic MRI with IV contrast may be of benefit for further characterization. 2. 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 Electronically Signed   By: Rogelia Myers M.D.   On: 12/13/2023 11:03       ASSESSMENT & PLAN: A total of 42 minutes was spent with the patient and counseling/coordination of care regarding preparing for this visit, review of most recent office visit notes, review of multiple chronic medical conditions and their management, review of all medications, review of x-ray images done today, pain management, review of most recent bloodwork results, review of health maintenance items, education on nutrition, prognosis, documentation, and need for follow up.   Problem List Items Addressed This Visit       Cardiovascular and Mediastinum   Hypertension   BP Readings from Last 3 Encounters:  12/13/23 134/82  05/17/23 (!) 150/80  05/06/23 130/80  Well-controlled hypertension Off medication         Other   Cancer (HCC)   History of colon cancer. Recent CT scan of abdomen and pelvis shows no recurrence      Obesity, morbid (HCC)   Diet and nutrition discussed Advised to decrease amount of daily carbohydrate intake and daily calories and increase amount of plant based protein in her diet      Mass of left hip region - Primary   Clinically stable.  History of colon cancer Differential diagnosis discussed Recommend x-ray today Soft tissue ultrasound Blood work today Follow-up after diagnostic workup is done Pain management discussed Recommend Tylenol  and or Advil  as needed for pain.      Relevant Orders   CBC with Differential/Platelet   Comprehensive metabolic panel with GFR   Hemoglobin A1c   Lipid panel   DG HIP UNILAT W OR W/O PELVIS 2-3 VIEWS LEFT   US  LT LOWER EXTREM LTD SOFT TISSUE NON VASCULAR   Patient Instructions  Health Maintenance After Age 10 After age 3, you are at a  higher risk for certain long-term diseases and infections as well as injuries from falls. Falls are a major cause of broken bones and head injuries in people who are older than age 35. Getting regular preventive care can help to keep you healthy and well. Preventive  care includes getting regular testing and making lifestyle changes as recommended by your health care provider. Talk with your health care provider about: Which screenings and tests you should have. A screening is a test that checks for a disease when you have no symptoms. A diet and exercise plan that is right for you. What should I know about screenings and tests to prevent falls? Screening and testing are the best ways to find a health problem early. Early diagnosis and treatment give you the best chance of managing medical conditions that are common after age 33. Certain conditions and lifestyle choices may make you more likely to have a fall. Your health care provider may recommend: Regular vision checks. Poor vision and conditions such as cataracts can make you more likely to have a fall. If you wear glasses, make sure to get your prescription updated if your vision changes. Medicine review. Work with your health care provider to regularly review all of the medicines you are taking, including over-the-counter medicines. Ask your health care provider about any side effects that may make you more likely to have a fall. Tell your health care provider if any medicines that you take make you feel dizzy or sleepy. Strength and balance checks. Your health care provider may recommend certain tests to check your strength and balance while standing, walking, or changing positions. Foot health exam. Foot pain and numbness, as well as not wearing proper footwear, can make you more likely to have a fall. Screenings, including: Osteoporosis screening. Osteoporosis is a condition that causes the bones to get weaker and break more easily. Blood pressure screening. Blood pressure changes and medicines to control blood pressure can make you feel dizzy. Depression screening. You may be more likely to have a fall if you have a fear of falling, feel depressed, or feel unable to do activities that you used to  do. Alcohol use screening. Using too much alcohol can affect your balance and may make you more likely to have a fall. Follow these instructions at home: Lifestyle Do not drink alcohol if: Your health care provider tells you not to drink. If you drink alcohol: Limit how much you have to: 0-1 drink a day for women. 0-2 drinks a day for men. Know how much alcohol is in your drink. In the [place] one drink equals one 12 oz bottle of beer (355 mL), one 5 oz glass of wine (148 mL), or one 1 oz glass of hard liquor (44 mL). Do not use any products that contain nicotine or tobacco. These products include cigarettes, chewing tobacco, and vaping devices, such as e-cigarettes. If you need help quitting, ask your health care provider. Activity  Follow a regular exercise program to stay fit. This will help you maintain your balance. Ask your health care provider what types of exercise are appropriate for you. If you need a cane or walker, use it as recommended by your health care provider. Wear supportive shoes that have nonskid soles. Safety  Remove any tripping hazards, such as rugs, cords, and clutter. Install safety equipment such as grab bars in bathrooms and safety rails on stairs. Keep rooms and walkways well-lit. General instructions Talk with your health care provider about your  risks for falling. Tell your health care provider if: You fall. Be sure to tell your health care provider about all falls, even ones that seem minor. You feel dizzy, tiredness (fatigue), or off-balance. Take over-the-counter and prescription medicines only as told by your health care provider. These include supplements. Eat a healthy diet and maintain a healthy weight. A healthy diet includes low-fat dairy products, low-fat (lean) meats, and fiber from whole grains, beans, and lots of fruits and vegetables. Stay current with your vaccines. Schedule regular health, dental, and eye exams. Summary Having a healthy  lifestyle and getting preventive care can help to protect your health and wellness after age 22. Screening and testing are the best way to find a health problem early and help you avoid having a fall. Early diagnosis and treatment give you the best chance for managing medical conditions that are more common for people who are older than age 66. Falls are a major cause of broken bones and head injuries in people who are older than age 59. Take precautions to prevent a fall at home. Work with your health care provider to learn what changes you can make to improve your health and wellness and to prevent falls. This information is not intended to replace advice given to you by your health care provider. Make sure you discuss any questions you have with your health care provider. Document Revised: 09/01/2020 Document Reviewed: 09/01/2020 Elsevier Patient Education  2024 Elsevier Inc.    Emil Schaumann, MD [facility]

## 2023-12-14 ENCOUNTER — Ambulatory Visit
Admission: RE | Admit: 2023-12-14 | Discharge: 2023-12-14 | Disposition: A | Discharge status: Home / Self Care | Source: Ambulatory Visit | Attending: Emergency Medicine | Admitting: Emergency Medicine

## 2023-12-14 DIAGNOSIS — R2242 Localized swelling, mass and lump, left lower limb: Secondary | ICD-10-CM

## 2024-01-25 ENCOUNTER — Ambulatory Visit: Admitting: Orthopaedic Surgery

## 2024-01-25 VITALS — Wt 204.0 lb

## 2024-01-25 DIAGNOSIS — M1612 Unilateral primary osteoarthritis, left hip: Secondary | ICD-10-CM

## 2024-01-25 DIAGNOSIS — M25552 Pain in left hip: Secondary | ICD-10-CM

## 2024-01-25 NOTE — Progress Notes (Signed)
 The patient is a very pleasant 73 year old female who does ambulate with a rolling walker secondary to severe debilitating arthritis involving her left hip.  She does have a history of a right hip replacement that was done in [place]  decades ago.  She says that hip is doing well.  She is originally from [place]  is back down here now.  She is not on blood thinning medication and her last hemoglobin A1c is 6.1.  She is a patient of Dr. Elnoria who is her primary care physician.  She says at this point her hip pain is daily and is definitely affecting her mobility, her quality of life and actives daily living.  This has been going on for many years now.  I was able to review her past medical history and medications within epic.  Examination of her right hip shows it moves smoothly and fluidly from a hip replacement.  Her left hip is essentially significant reduction in motion and is incredibly stiff and I can barely rotate the hip around and it does show severe pain in the groin with rotation.  Recent x-rays of the pelvis and left hip shows severe end-stage bone-on-bone arthritis.  A CT scan of her abdomen and pelvis a year ago also shows the severe end-stage arthritis.  She feels like there is a mass in the area but I could not see this on any of the imaging studies and certainly she could have either a lipoma or even just a hematoma from a fall that she has had.  I did give her handout about direct anterior hip replacement surgery and described in detail what the surgery involves.  We discussed the risks and benefits of surgery and what to expect from an intraoperative and postoperative standpoint.  She has been to talk to her daughter further about this so she does have our surgery scheduler's card and my card.  All questions and concerns were answered addressed.  She understands that we are easily scheduled out about 5 weeks or so.  If she decides to have this done she will reach out to us  and let us   know.  We are also happy to see her at any time to answer questions or go over things again.

## 2024-01-26 ENCOUNTER — Telehealth: Payer: Self-pay | Admitting: Orthopaedic Surgery

## 2024-01-26 NOTE — Telephone Encounter (Signed)
 Pt called stating she would like to move forward with surgery. Pt also would like to note that she is a [place] witness and do not give blood.I informed pt to notify hospital when they call for pre admission when she is schedule for surgery. Pt phone number is [phone].

## 2024-02-07 ENCOUNTER — Other Ambulatory Visit: Payer: Self-pay | Admitting: Nephrology

## 2024-02-07 DIAGNOSIS — N183 Chronic kidney disease, stage 3 unspecified: Secondary | ICD-10-CM

## 2024-02-08 ENCOUNTER — Other Ambulatory Visit: Payer: Self-pay | Admitting: Physician Assistant

## 2024-02-08 DIAGNOSIS — Z01818 Encounter for other preprocedural examination: Secondary | ICD-10-CM

## 2024-02-09 ENCOUNTER — Ambulatory Visit
Admission: RE | Admit: 2024-02-09 | Discharge: 2024-02-09 | Disposition: A | Discharge status: Home / Self Care | Source: Ambulatory Visit | Attending: Nephrology | Admitting: Nephrology

## 2024-02-09 DIAGNOSIS — N183 Chronic kidney disease, stage 3 unspecified: Secondary | ICD-10-CM

## 2024-02-15 NOTE — Progress Notes (Signed)
 Surgical Instructions   Your procedure is scheduled on October 28. Report to [facility] Main Entrance A at 8:15 A.M., then check in with the Admitting office. Any questions or running late day of surgery: call [phone]  Questions prior to your surgery date: call [phone], Monday-Friday, 8am-4pm. If you experience any cold or flu symptoms such as cough, fever, chills, shortness of breath, etc. between now and your scheduled surgery, please notify us  at the above number.     Remember:  Do not eat after midnight the night before your surgery  You may drink clear liquids until 7:15am the morning of your surgery.   Clear liquids allowed are: Water, Non-Citrus Juices (without pulp), Carbonated Beverages, Clear Tea (no milk, honey, etc.), Black Coffee Only (NO MILK, CREAM OR POWDERED CREAMER of any kind), and Gatorade. Patient Instructions  The night before surgery:  No food after midnight. ONLY clear liquids after midnight  The day of surgery (if you do NOT have diabetes):  Drink ONE (1) Pre-Surgery Clear Ensure by 7:15am the morning of surgery. Drink in one sitting. Do not sip.  This drink was given to you during your hospital  pre-op appointment visit.  Nothing else to drink after completing the  Pre-Surgery Clear Ensure.         If you have questions, please contact your surgeon's office.    Take these medicines the morning of surgery with A SIP OF WATER :NONE   May take these medicines IF NEEDED: albuterol  (PROVENTIL  HFA;VENTOLIN  HFA) 108 (90 BASE) MCG/ACT inhaler -please bring inhaler to the hospital.   One week prior to surgery, STOP taking any Aspirin (unless otherwise instructed by your surgeon) Aleve , Naproxen , Ibuprofen , Motrin , Advil , Goody's, BC's, all herbal medications, fish oil, and non-prescription vitamins.                     Do NOT Smoke (Tobacco/Vaping) for 24 hours prior to your procedure.  If you use a CPAP at night, you may bring your  mask/headgear for your overnight stay.   You will be asked to remove any contacts, glasses, piercing's, hearing aid's, dentures/partials prior to surgery. Please bring cases for these items if needed.    Patients discharged the day of surgery will not be allowed to drive home, and someone needs to stay with them for 24 hours.  SURGICAL WAITING ROOM VISITATION Patients may have no more than 2 support people in the waiting area - these visitors may rotate.   Pre-op nurse will coordinate an appropriate time for 1 ADULT support person, who may not rotate, to accompany patient in pre-op.  Children under the age of 69 must have an adult with them who is not the patient and must remain in the main waiting area with an adult.  If the patient needs to stay at the hospital during part of their recovery, the visitor guidelines for inpatient rooms apply.  Please refer to the [facility] website for the visitor guidelines for any additional information.   If you received a COVID test during your pre-op visit  it is requested that you wear a mask when out in public, stay away from anyone that may not be feeling well and notify your surgeon if you develop symptoms. If you have been in contact with anyone that has tested positive in the last 10 days please notify you surgeon.      Pre-operative 4 CHG Bathing Instructions   You can play a key role in  reducing the risk of infection after surgery. Your skin needs to be as free of germs as possible. You can reduce the number of germs on your skin by washing with CHG (chlorhexidine gluconate) soap before surgery. CHG is an antiseptic soap that kills germs and continues to kill germs even after washing.   DO NOT use if you have an allergy to chlorhexidine/CHG or antibacterial soaps. If your skin becomes reddened or irritated, stop using the CHG and notify one of our RNs at [phone].   Please shower with the CHG soap starting 4 days before surgery using the  following schedule:     Please keep in mind the following:  DO NOT shave, including legs and underarms, starting the day of your first shower.   You may shave your face at any point before/day of surgery.  Place clean sheets on your bed the day you start using CHG soap. Use a clean washcloth (not used since being washed) for each shower. DO NOT sleep with pets once you start using the CHG.   CHG Shower Instructions:  Wash your face and private area with normal soap. If you choose to wash your hair, wash first with your normal shampoo.  After you use shampoo/soap, rinse your hair and body thoroughly to remove shampoo/soap residue.  Turn the water OFF and apply  bottle of CHG soap to a CLEAN washcloth.  Apply CHG soap ONLY FROM YOUR NECK DOWN TO YOUR TOES (washing for 3-5 minutes)  DO NOT use CHG soap on face, private areas, open wounds, or sores.  Pay special attention to the area where your surgery is being performed.  If you are having back surgery, having someone wash your back for you may be helpful. Wait 2 minutes after CHG soap is applied, then you may rinse off the CHG soap.  Pat dry with a clean towel  Put on clean clothes/pajamas   If you choose to wear lotion, please use ONLY the CHG-compatible lotions that are listed below.  Additional instructions for the day of surgery:  If you choose, you may shower the morning of surgery with an antibacterial soap.  DO NOT APPLY any lotions, deodorants, cologne, or perfumes.   Do not bring valuables to the hospital. [facility] is not responsible for any belongings/valuables. Do not wear nail polish, gel polish, artificial nails, or any other type of covering on natural nails (fingers and toes) Do not wear jewelry or makeup Put on clean/comfortable clothes.  Please brush your teeth.  Ask your nurse before applying any prescription medications to the skin.     CHG Compatible Lotions   Aveeno Moisturizing lotion  Cetaphil  Moisturizing Cream  Cetaphil Moisturizing Lotion  Clairol Herbal Essence Moisturizing Lotion, Dry Skin  Clairol Herbal Essence Moisturizing Lotion, Extra Dry Skin  Clairol Herbal Essence Moisturizing Lotion, Normal Skin  Curel Age Defying Therapeutic Moisturizing Lotion with Alpha Hydroxy  Curel Extreme Care Body Lotion  Curel Soothing Hands Moisturizing Hand Lotion  Curel Therapeutic Moisturizing Cream, Fragrance-Free  Curel Therapeutic Moisturizing Lotion, Fragrance-Free  Curel Therapeutic Moisturizing Lotion, Original Formula  Eucerin Daily Replenishing Lotion  Eucerin Dry Skin Therapy Plus Alpha Hydroxy Crme  Eucerin Dry Skin Therapy Plus Alpha Hydroxy Lotion  Eucerin Original Crme  Eucerin Original Lotion  Eucerin Plus Crme Eucerin Plus Lotion  Eucerin TriLipid Replenishing Lotion  Keri Anti-Bacterial Hand Lotion  Keri Deep Conditioning Original Lotion Dry Skin Formula Softly Scented  Keri Deep Conditioning Original Lotion, Fragrance Free Sensitive Skin  Formula  Keri Lotion Fast Absorbing Fragrance Free Sensitive Skin Formula  Keri Lotion Fast Absorbing Softly Scented Dry Skin Formula  Keri Original Lotion  Keri Skin Renewal Lotion Keri Silky Smooth Lotion  Keri Silky Smooth Sensitive Skin Lotion  Nivea Body Creamy Conditioning Oil  Nivea Body Extra Enriched Lotion  Nivea Body Original Lotion  Nivea Body Sheer Moisturizing Lotion Nivea Crme  Nivea Skin Firming Lotion  NutraDerm 30 Skin Lotion  NutraDerm Skin Lotion  NutraDerm Therapeutic Skin Cream  NutraDerm Therapeutic Skin Lotion  ProShield Protective Hand Cream  Provon moisturizing lotion  Please read over the following fact sheets that you were given.

## 2024-02-16 ENCOUNTER — Encounter (HOSPITAL_COMMUNITY): Payer: Self-pay

## 2024-02-16 ENCOUNTER — Encounter (HOSPITAL_COMMUNITY)
Admission: RE | Admit: 2024-02-16 | Discharge: 2024-02-16 | Disposition: A | Discharge status: Home / Self Care | Source: Ambulatory Visit | Attending: Orthopaedic Surgery | Admitting: Orthopaedic Surgery

## 2024-02-16 ENCOUNTER — Other Ambulatory Visit: Payer: Self-pay

## 2024-02-16 VITALS — BP 191/75 | HR 89 | Temp 98.4°F | Resp 16 | Ht 63.0 in | Wt 192.8 lb

## 2024-02-16 DIAGNOSIS — I252 Old myocardial infarction: Secondary | ICD-10-CM | POA: Diagnosis not present

## 2024-02-16 DIAGNOSIS — N189 Chronic kidney disease, unspecified: Secondary | ICD-10-CM | POA: Diagnosis not present

## 2024-02-16 DIAGNOSIS — M1612 Unilateral primary osteoarthritis, left hip: Secondary | ICD-10-CM | POA: Diagnosis not present

## 2024-02-16 DIAGNOSIS — E785 Hyperlipidemia, unspecified: Secondary | ICD-10-CM | POA: Insufficient documentation

## 2024-02-16 DIAGNOSIS — J45909 Unspecified asthma, uncomplicated: Secondary | ICD-10-CM | POA: Diagnosis not present

## 2024-02-16 DIAGNOSIS — Z01818 Encounter for other preprocedural examination: Secondary | ICD-10-CM | POA: Insufficient documentation

## 2024-02-16 DIAGNOSIS — D649 Anemia, unspecified: Secondary | ICD-10-CM | POA: Insufficient documentation

## 2024-02-16 DIAGNOSIS — I129 Hypertensive chronic kidney disease with stage 1 through stage 4 chronic kidney disease, or unspecified chronic kidney disease: Secondary | ICD-10-CM | POA: Diagnosis not present

## 2024-02-16 DIAGNOSIS — K219 Gastro-esophageal reflux disease without esophagitis: Secondary | ICD-10-CM | POA: Diagnosis not present

## 2024-02-16 LAB — BASIC METABOLIC PANEL WITH GFR
Anion gap: 8 (ref 5–15)
BUN: 22 mg/dL (ref 8–23)
CO2: 25 mmol/L (ref 22–32)
Calcium: 10.6 mg/dL — ABNORMAL HIGH (ref 8.9–10.3)
Chloride: 104 mmol/L (ref 98–111)
Creatinine, Ser: 1.38 mg/dL — ABNORMAL HIGH (ref 0.44–1.00)
GFR, Estimated: 40 mL/min — ABNORMAL LOW (ref >60)
Glucose, Bld: 88 mg/dL (ref 70–99)
Potassium: 4.2 mmol/L (ref 3.5–5.1)
Sodium: 137 mmol/L (ref 135–145)

## 2024-02-16 LAB — CBC
HCT: 33.7 % — ABNORMAL LOW (ref 36.0–46.0)
Hemoglobin: 10.4 g/dL — ABNORMAL LOW (ref 12.0–15.0)
MCH: 28.7 pg (ref 26.0–34.0)
MCHC: 30.9 g/dL (ref 30.0–36.0)
MCV: 92.8 fL (ref 80.0–100.0)
Platelets: 222 K/uL (ref 150–400)
RBC: 3.63 MIL/uL — ABNORMAL LOW (ref 3.87–5.11)
RDW: 13.2 % (ref 11.5–15.5)
WBC: 6.3 K/uL (ref 4.0–10.5)
nRBC: 0 % (ref 0.0–0.2)

## 2024-02-16 LAB — SURGICAL PCR SCREEN

## 2024-02-16 LAB — NO BLOOD PRODUCTS

## 2024-02-16 NOTE — Progress Notes (Signed)
 PCP - Aloysius Emil Schaumann, MD Cardiologist - Reyes Katz,MD- LOV 12/07/11- follow up in 1 year   PPM/ICD - Denies Device Orders -  Rep Notified -   Chest x-ray - na EKG - 02/16/24 Stress Test -  ECHO - 10/29/2010- echocardiogram stress test Cardiac Cath - 2007  Sleep Study - denies CPAP - no  Fasting Blood Sugar - na Checks Blood Sugar _____ times a day  Last dose of GLP1 agonist-  na GLP1 instructions: na  Blood Thinner Instructions:na Aspirin Instructions:na  ERAS Protcol -clear liquids until 0715 PRE-SURGERY Ensure or G2- Ensure  COVID TEST- na   Anesthesia review: yes-hx CAD,HTN,MI. Last saw cardiology in 2013.Pt was to follow up in one year. Pt stated she did not need to go back.   Patient denies shortness of breath, fever, cough and chest pain at PAT appointment   All instructions explained to the patient, with a verbal understanding of the material. Patient agrees to go over the instructions while at home for a better understanding.he opportunity to ask questions was provided.

## 2024-02-17 NOTE — Progress Notes (Signed)
 Anesthesia Chart Review:   Case: [id number] Date/Time: 02/21/24 1000   Procedure: ARTHROPLASTY, HIP, TOTAL, ANTERIOR APPROACH (Left: Hip)   Anesthesia type: Spinal   Diagnosis: Primary osteoarthritis of left hip [M16.12]   Pre-op diagnosis: osteoarthritis left hip   Location: [facility] ROOM 02 / [facility] OR   Surgeons: Vernetta Lonni GRADE, MD       DISCUSSION: Patient is a 73 year old female scheduled for the above procedure. She was referred by her PCP Purcell Emil Schanz, MD for left hip pain with severe OA by xray.  History includes never smoker, HTN, HLD, asthma, CKD, GERD, anemia (no blood products), osteoarthritis (right THA 2007), colon cancer (s/p colon resection in 2004). She reported MI in [place] in 2007 but that cath was okay (no records to confirm history, no evidence of infarct on 2009 stress test, see below).   According to consultation for chest pain (with elevated CK MB with normal serial troponins) by cardiologist Dr. Reyes Forget on 09/25/2010, The patient has moved to this area from [place] .  She previously has a history of chest pain.  She says that she had a heart attack.  However, she gives a story that she underwent cardiac catheterization in 2007 and was told it was 'okay.' He received some records from [facility] including a 03/12/2008 stress test showing no significant ischemia and no fixed defects to suggest scar from previous myocardial infarction, LV EF 63%. She also had a 2010 TTE showing normal LVEF, trace MR/TR and an equivocal ETT (scanned under Media tab).  He ordered a stress echo stress echo which was done on 10/29/2010 and showed limited exercise, LVEF 60%, no EKG changes, not all walls could be assessed but no definite abnormality.  Her repeat Total CK remained elevated even in follow-up on 11/24/2010, so felt to be chronically elevated so deferred to primary care for any additional testing.  She has not seen cardiology since.   She is a  Scientist, Product/process Development. She signed a form for no blood or blood products. H/H 10.3/33.7, previously ~ 11/34 over the past year.   Creatinine appears stable at 1.38.  A1c 6.1% on 12/13/2023.  BP 191/75 at PAT. I do not see that it was rechecked. Previous reading 134/82 on 12/13/2023. She is not on any BP medications. 02/16/2024 EKG showed NSR, LAD, moderate voltage criteria for LVH, non-specific T wave abnormality--tracing appears stable when compared to 04/28/2016 tracing. She denied chest pain and SOB per PAT RN interview.  As above, question of remote MI about 18 years ago but with reported unremarkable cath and subsequent nuclear stress test in 2009 and TTE in 2010 did not show evidence of infarct (no scar, normal LVEF). Other than by her report, no definite records received to support or deny history. Last stress echo was in 2012 which had some limitations but was felt to not have any definitive abnormalities.  No CV symptoms reported. EKG appears stable. Activity is currently limited by severe OA. Last A1c < 6.5%, HGB and Creatinine are abnormal, but overall stable. She is a Scientist, Product/process Development. Her BP was significantly elevated at her PAT visit, and unfortunately was not rechecked--but historically BP readings have not been as high, and HTN even classified as Well-controlled at her last PCP visit o 12/13/2023. Discussed with anesthesiologist Keneth Duncans, MD. Anesthesia team to evaluate on the day of surgery.     VS: BP (!) 191/75   Pulse 89   Temp 36.9 C   Resp  16   Ht 5' 3 (1.6 m)   Wt 87.5 kg   SpO2 100%   BMI 34.15 kg/m  BP Readings from Last 3 Encounters:  02/16/24 (!) 191/75  12/13/23 134/82  05/17/23 (!) 150/80     PROVIDERS: Purcell Emil Schanz, MD is PCP  Legrand Victory MOULD, MD is GI Tobie Heinz, MD is nephrologist   LABS: Preoperative labs noted. See DISCUSSION. (all labs ordered are listed, but only abnormal results are displayed)  Labs Reviewed  SURGICAL PCR SCREEN -  Abnormal; Notable for the following components:      Result Value   MRSA, PCR   (*)    Value: INVALID, UNABLE TO DETERMINE THE PRESENCE OF TARGET DUE TO SPECIMEN INTEGRITY. RECOLLECTION REQUESTED.   Staphylococcus aureus   (*)    Value: INVALID, UNABLE TO DETERMINE THE PRESENCE OF TARGET DUE TO SPECIMEN INTEGRITY. RECOLLECTION REQUESTED.   All other components within normal limits  CBC - Abnormal; Notable for the following components:   RBC 3.63 (*)    Hemoglobin 10.4 (*)    HCT 33.7 (*)    All other components within normal limits  BASIC METABOLIC PANEL WITH GFR - Abnormal; Notable for the following components:   Creatinine, Ser 1.38 (*)    Calcium 10.6 (*)    GFR, Estimated 40 (*)    All other components within normal limits  NO BLOOD PRODUCTS     IMAGES: US  Renal 02/09/2024: IMPRESSION: Asymmetrically smaller RIGHT kidney. Otherwise unremarkable sonographic evaluation of the kidneys.   Xray left hip 12/13/2023: IMPRESSION: 1. No acute fracture, pelvic bone diastasis, or dislocation. No soft tissue mass appreciated. If concern persists for a left hip mass, a pelvic MRI with IV contrast may be of benefit for further characterization. 2. 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    EKG: 02/16/2024: Normal sinus rhythm Left axis deviation Moderate voltage criteria for LVH, may be normal variant ( R in aVL , Cornell product ) Nonspecific T wave abnormality Abnormal ECG When compared with ECG of 28-Apr-2016 16:59, PREVIOUS ECG IS PRESENT Confirmed by Court Carrier [phone]) on 02/16/2024 4:16:04 PM - Overall, I think tracing is not significantly changed when compared to 05/08/16 EKG.   CV: Stress echo 10/22/2010: Study Conclusions  - Stress ECG conclusions: The stress ECG was normal.  - Baseline: LV global systolic function was normal. The estimated LV    ejection fraction was 60%. Normal wall motion; no LV regional wall    motion  abnormalities.  - Peak stress: LV global systolic function was vigorous. Inadequate    visualization for the regional LV evaluation at this stage.  - Impressions: There was SOB with walking. Exercise tolerance was    limited. There was no significant EKG change. The stress image in    the apical four chanber view is inadequate. I can not fully assess    the apical inferoseptal segment and the apical lateral segment.    All other segments show normal increased thickening and    contractility with stress. Therefore, no definite abnormality is    noted, but I can not be sure about two segments.   See DISCUSSION for details of testing from 2009 - 2010.   Past Medical History:  Diagnosis Date   Allergy    Anemia    Arthritis    Asthma    Cancer (HCC)    colon    Chest pain    Hospital 09/2010,  /  Stress echo October 29, 2010, vigorous LV function with stress.  All walls could not be assessed fully but it was felt that there was no ischemia.   Coronary artery disease    MI per patient 2007 elsewhere, no records, pt. says cath  was [facility]   Diverticulitis    Elevated CPK    Hospital 09/2010,  troponin normal   GERD (gastroesophageal reflux disease)    Hyperlipidemia    Hypertension    Kidney stone    MI (myocardial infarction) (HCC)    2007    Past Surgical History:  Procedure Laterality Date   ABDOMINAL HYSTERECTOMY     CESAREAN SECTION     x 2   COLON SURGERY     2004   FOOT SURGERY     left 1999   HERNIA REPAIR     three total, 2007   SHOULDER SURGERY     left shoulder 2008   TOTAL HIP ARTHROPLASTY     right hip 2007    MEDICATIONS:  albuterol  (PROVENTIL  HFA;VENTOLIN  HFA) 108 (90 BASE) MCG/ACT inhaler   Ibuprofen  (ADVIL ) 200 MG CAPS   Menthol-Methyl Salicylate (MUSCLE RUB) 10-15 % CREA   Menthol-Methyl Salicylate (SALONPAS PAIN RELIEF PATCH) PTCH   No current facility-administered medications for this encounter.    Isaiah Ruder, PA-C Surgical Short  Stay/Anesthesiology [facility] Phone [phone] [facility] Phone [phone] 02/20/2024 10:09 AM

## 2024-02-17 NOTE — Progress Notes (Signed)
 Surgical PCR result invalid. Will need to be recollected DOS. Order placed.

## 2024-02-20 NOTE — Anesthesia Preprocedure Evaluation (Signed)
 Anesthesia Evaluation  Patient identified by MRN, date of birth, ID band Patient awake    Reviewed: Allergy & Precautions, NPO status , Patient's Chart, lab work & pertinent test results  Airway Mallampati: III  TM Distance: >3 FB     Dental  (+) Upper Dentures, Lower Dentures   Pulmonary asthma    Pulmonary exam normal breath sounds clear to auscultation       Cardiovascular hypertension, Pt. on medications + CAD and + Past MI  Normal cardiovascular exam Rhythm:Regular Rate:Normal  MI 2007  Cath normal  EKG 02/16/24 Normal sinus rhythm Left axis deviation Moderate voltage criteria for LVH, may be normal variant ( R in aVL , Cornell product ) Nonspecific T wave abnormality  Echo 2012 Stress ECG conclusions: The stress ECG was normal.  - Baseline: LV global systolic function was normal. The estimated LV    ejection fraction was 60%. Normal wall motion; no LV regional wall    motion abnormalities.  - Peak stress: LV global systolic function was vigorous. Inadequate    visualization for the regional LV evaluation at this stage.  - Impressions: There was SOB with walking. Exercise tolerance was    limited. There was no significant EKG change. The stress image in    the apical four chanber view is inadequate. I can not fully assess    the apical inferoseptal segment and the apical lateral segment.    All other segments show normal increased thickening and    contractility with stress. Therefore, no definite abnormality is    noted, but I can not be sure about two segments.     Neuro/Psych negative neurological ROS  negative psych ROS   GI/Hepatic Neg liver ROS,GERD  Medicated,,s/p colon resection in 2004   Endo/Other  Obesity HLD  Renal/GU Renal InsufficiencyRenal diseaseHx/o renal calculi Lab Results      Component                Value               Date                      NA                       137                  02/16/2024                CL                       104                 02/16/2024                K                        4.2                 02/16/2024                CO2                      25                  02/16/2024  BUN                      22                  02/16/2024                CREATININE               1.38 (H)            02/16/2024                GFRNONAA                 40 (L)              02/16/2024                CALCIUM                  10.6 (H)            02/16/2024                ALBUMIN                  4.2                 12/13/2023                GLUCOSE                  88                  02/16/2024             negative genitourinary   Musculoskeletal  (+) Arthritis , Osteoarthritis,  OA left hip   Abdominal  (+) + obese  Peds  Hematology  (+) Blood dyscrasia, anemia , REFUSES BLOOD PRODUCTS, JEHOVAH'S WITNESSLab Results      Component                Value               Date                      WBC                      6.3                 02/16/2024                HGB                      10.4 (L)            02/16/2024                HCT                      33.7 (L)            02/16/2024                MCV                      92.8                02/16/2024  PLT                      222                 02/16/2024              Anesthesia Other Findings   Reproductive/Obstetrics                              Anesthesia Physical Anesthesia Plan  ASA: 3  Anesthesia Plan: General   Post-op Pain Management: Minimal or no pain anticipated, Dilaudid  IV, Precedex and Ofirmev  IV (intra-op)*   Induction: Intravenous  PONV Risk Score and Plan: 3 and Treatment may vary due to age or medical condition, Propofol infusion and Ondansetron   Airway Management Planned: Oral ETT  Additional Equipment: None  Intra-op Plan:   Post-operative Plan: Extubation in OR  Informed Consent: I have reviewed  the patients History and Physical, chart, labs and discussed the procedure including the risks, benefits and alternatives for the proposed anesthesia with the patient or authorized representative who has indicated his/her understanding and acceptance.     Dental advisory given  Plan Discussed with: CRNA and Anesthesiologist  Anesthesia Plan Comments: (PAT note written 02/20/2024 by Allison Zelenak, PA-C.  Patient refuses SAB  )         Anesthesia Quick Evaluation

## 2024-02-20 NOTE — H&P (Signed)
 TOTAL HIP ADMISSION H&P  Patient is admitted for left total hip arthroplasty.  Subjective:  Chief Complaint: left hip pain  HPI: Angela Patton, 73 y.o. female, has a history of pain and functional disability in the left hip(s) due to arthritis and patient has failed non-surgical conservative treatments for greater than 12 weeks to include use of assistive devices, weight reduction as appropriate, and activity modification.  Onset of symptoms was gradual starting several years ago with gradually worsening course since that time.The patient noted no past surgery on the left hip(s).  Patient currently rates pain in the left hip at 10 out of 10 with activity. Patient has night pain, worsening of pain with activity and weight bearing, trendelenberg gait, pain that interfers with activities of daily living, and pain with passive range of motion. Patient has evidence of subchondral cysts, subchondral sclerosis, periarticular osteophytes, and joint space narrowing by imaging studies. This condition presents safety issues increasing the risk of falls.  There is no current active infection.  Patient Active Problem List   Diagnosis Date Noted   Obesity, morbid (HCC) 12/13/2023   Mass of left hip region 12/13/2023   Stage 3b chronic kidney disease (HCC) 12/13/2023   Unilateral primary osteoarthritis, left hip 12/13/2023   Left lower quadrant abdominal pain 02/09/2017   Personal history of colon cancer 02/09/2017   Hypertension    Coronary artery disease    Cancer (HCC)    Hyperlipidemia    Past Medical History:  Diagnosis Date   Allergy    Anemia    Arthritis    Asthma    Cancer (HCC)    colon    Chest pain    Hospital 09/2010,  /   Stress echo October 29, 2010, vigorous LV function with stress.  All walls could not be assessed fully but it was felt that there was no ischemia.   Coronary artery disease    MI per patient 2007 elsewhere, no records, pt. says cath  was [facility]   Diverticulitis     Elevated CPK    Hospital 09/2010,  troponin normal   GERD (gastroesophageal reflux disease)    Hyperlipidemia    Hypertension    Kidney stone    MI (myocardial infarction) (HCC)    2007    Past Surgical History:  Procedure Laterality Date   ABDOMINAL HYSTERECTOMY     CESAREAN SECTION     x 2   COLON SURGERY     2004   FOOT SURGERY     left 1999   HERNIA REPAIR     three total, 2007   SHOULDER SURGERY     left shoulder 2008   TOTAL HIP ARTHROPLASTY     right hip 2007    No current facility-administered medications for this encounter.   Current Outpatient Medications  Medication Sig Dispense Refill Last Dose/Taking   albuterol  (PROVENTIL  HFA;VENTOLIN  HFA) 108 (90 BASE) MCG/ACT inhaler Inhale 1-2 puffs into the lungs every 6 (six) hours as needed for wheezing or shortness of breath. 1 Inhaler 0 Taking As Needed   Ibuprofen  (ADVIL ) 200 MG CAPS Take 400 mg by mouth every 8 (eight) hours as needed (pain.).   Taking As Needed   Menthol-Methyl Salicylate (MUSCLE RUB) 10-15 % CREA Apply 1 Application topically as needed for muscle pain.   Taking As Needed   Menthol-Methyl Salicylate (SALONPAS PAIN RELIEF PATCH) PTCH Place 1 patch onto the skin daily as needed (pain.).   Taking As Needed  Allergies  Allergen Reactions   Penicillins Hives and Swelling    Has patient had a PCN reaction causing immediate rash, facial/tongue/throat swelling, SOB or lightheadedness with hypotension:  Has patient had a PCN reaction causing severe rash involving mucus membranes or skin necrosis:  Has patient had a PCN reaction that required hospitalization:  Has patient had a PCN reaction occurring within the last 10 years:  If all of the above answers are NO, then may proceed with Cephalosporin use.    Other     [employer]ov[employer]-refuse blood transfusions and blood products     Social History   Tobacco Use   Smoking status: Never   Smokeless tobacco: Never  Substance Use Topics   Alcohol use:  Yes    Comment: Rare    Family History  Problem Relation Age of Onset   Bone cancer Mother    Colon cancer Father    Diabetes Sister    Diabetes Sister    Diabetes Sister    Hodgkin's lymphoma Brother    Stomach cancer Brother    Esophageal cancer Neg Hx    Rectal cancer Neg Hx      Review of Systems  Objective:  Physical Exam Vitals reviewed.  Constitutional:      Appearance: Normal appearance. She is obese.  HENT:     Head: Normocephalic and atraumatic.  Eyes:     Extraocular Movements: Extraocular movements intact.     Pupils: Pupils are equal, round, and reactive to light.  Cardiovascular:     Rate and Rhythm: Normal rate and regular rhythm.  Pulmonary:     Effort: Pulmonary effort is normal.     Breath sounds: Normal breath sounds.  Abdominal:     Palpations: Abdomen is soft.  Musculoskeletal:     Cervical back: Normal range of motion and neck supple.     Left hip: Tenderness and bony tenderness present. Decreased range of motion. Decreased strength.  Neurological:     Mental Status: She is alert and oriented to person, place, and time.  Psychiatric:        Behavior: Behavior normal.     Vital signs in last 24 hours:    Labs:   Estimated body mass index is 34.15 kg/m as calculated from the following:   Height as of 02/16/24: 5' 3 (1.6 m).   Weight as of 02/16/24: 87.5 kg.   Imaging Review Plain radiographs demonstrate severe degenerative joint disease of the left hip(s). The bone quality appears to be good for age and reported activity level.      Assessment/Plan:  End stage arthritis, left hip(s)  The patient history, physical examination, clinical judgement of the provider and imaging studies are consistent with end stage degenerative joint disease of the left hip(s) and total hip arthroplasty is deemed medically necessary. The treatment options including medical management, injection therapy, arthroscopy and arthroplasty were discussed at  length. The risks and benefits of total hip arthroplasty were presented and reviewed. The risks due to aseptic loosening, infection, stiffness, dislocation/subluxation,  thromboembolic complications and other imponderables were discussed.  The patient acknowledged the explanation, agreed to proceed with the plan and consent was signed. Patient is being admitted for inpatient treatment for surgery, pain control, PT, OT, prophylactic antibiotics, VTE prophylaxis, progressive ambulation and ADL's and discharge planning.The patient is planning to be discharged home with home health services

## 2024-02-21 ENCOUNTER — Ambulatory Visit (HOSPITAL_COMMUNITY)

## 2024-02-21 ENCOUNTER — Ambulatory Visit (HOSPITAL_COMMUNITY): Payer: Self-pay | Admitting: Vascular Surgery

## 2024-02-21 ENCOUNTER — Observation Stay (HOSPITAL_COMMUNITY)

## 2024-02-21 ENCOUNTER — Other Ambulatory Visit: Payer: Self-pay

## 2024-02-21 ENCOUNTER — Encounter (HOSPITAL_COMMUNITY): Admission: RE | Disposition: A | Payer: Self-pay | Source: Ambulatory Visit | Attending: Orthopaedic Surgery

## 2024-02-21 ENCOUNTER — Observation Stay (HOSPITAL_COMMUNITY)
Admission: RE | Admit: 2024-02-21 | Discharge: 2024-02-24 | Disposition: A | Discharge status: Home / Self Care | Source: Ambulatory Visit | Attending: Orthopaedic Surgery | Admitting: Orthopaedic Surgery

## 2024-02-21 ENCOUNTER — Encounter (HOSPITAL_COMMUNITY): Payer: Self-pay | Admitting: Orthopaedic Surgery

## 2024-02-21 DIAGNOSIS — I2510 Atherosclerotic heart disease of native coronary artery without angina pectoris: Secondary | ICD-10-CM | POA: Insufficient documentation

## 2024-02-21 DIAGNOSIS — Z96642 Presence of left artificial hip joint: Secondary | ICD-10-CM

## 2024-02-21 DIAGNOSIS — N1832 Chronic kidney disease, stage 3b: Secondary | ICD-10-CM | POA: Diagnosis not present

## 2024-02-21 DIAGNOSIS — M25552 Pain in left hip: Secondary | ICD-10-CM | POA: Diagnosis present

## 2024-02-21 DIAGNOSIS — Z01818 Encounter for other preprocedural examination: Principal | ICD-10-CM

## 2024-02-21 DIAGNOSIS — M1612 Unilateral primary osteoarthritis, left hip: Principal | ICD-10-CM | POA: Insufficient documentation

## 2024-02-21 DIAGNOSIS — I129 Hypertensive chronic kidney disease with stage 1 through stage 4 chronic kidney disease, or unspecified chronic kidney disease: Secondary | ICD-10-CM

## 2024-02-21 DIAGNOSIS — J45909 Unspecified asthma, uncomplicated: Secondary | ICD-10-CM | POA: Diagnosis not present

## 2024-02-21 DIAGNOSIS — Z7982 Long term (current) use of aspirin: Secondary | ICD-10-CM | POA: Insufficient documentation

## 2024-02-21 DIAGNOSIS — Z85038 Personal history of other malignant neoplasm of large intestine: Secondary | ICD-10-CM | POA: Diagnosis not present

## 2024-02-21 DIAGNOSIS — I252 Old myocardial infarction: Secondary | ICD-10-CM | POA: Diagnosis not present

## 2024-02-21 DIAGNOSIS — Z96641 Presence of right artificial hip joint: Secondary | ICD-10-CM | POA: Insufficient documentation

## 2024-02-21 HISTORY — PX: TOTAL HIP ARTHROPLASTY: SHX124

## 2024-02-21 LAB — SURGICAL PCR SCREEN
MRSA, PCR: NEGATIVE
Staphylococcus aureus: NEGATIVE

## 2024-02-21 SURGERY — ARTHROPLASTY, HIP, TOTAL, ANTERIOR APPROACH
Anesthesia: General | Site: Hip | Laterality: Left

## 2024-02-21 MED ORDER — MIDAZOLAM HCL (PF) 2 MG/2ML IJ SOLN
INTRAMUSCULAR | Status: DC | PRN
  Administered 2024-02-21: 2 mg via INTRAVENOUS

## 2024-02-21 MED ORDER — METOCLOPRAMIDE HCL 5 MG/ML IJ SOLN: 5.0000 mg | Freq: Three times a day (TID) | INTRAMUSCULAR | Status: DC | PRN

## 2024-02-21 MED ORDER — ACETAMINOPHEN 325 MG PO TABS
325.0000 mg | ORAL_TABLET | Freq: Four times a day (QID) | ORAL | Status: DC | PRN
  Administered 2024-02-22: 650 mg via ORAL
  Filled 2024-02-21: qty 2

## 2024-02-21 MED ORDER — ACETAMINOPHEN 10 MG/ML IV SOLN
INTRAVENOUS | Status: DC | PRN
  Administered 2024-02-21: 1000 mg via INTRAVENOUS

## 2024-02-21 MED ORDER — OXYCODONE HCL 5 MG PO TABS
10.0000 mg | ORAL_TABLET | ORAL | Status: DC | PRN
  Administered 2024-02-22 (×2): 15 mg via ORAL
  Filled 2024-02-21 (×2): qty 3

## 2024-02-21 MED ORDER — METOCLOPRAMIDE HCL 5 MG PO TABS: 5.0000 mg | ORAL_TABLET | Freq: Three times a day (TID) | ORAL | Status: DC | PRN

## 2024-02-21 MED ORDER — DIPHENHYDRAMINE HCL 12.5 MG/5ML PO ELIX: 12.5000 mg | ORAL_SOLUTION | ORAL | Status: DC | PRN

## 2024-02-21 MED ORDER — CEFAZOLIN SODIUM-DEXTROSE 2-4 GM/100ML-% IV SOLN
2.0000 g | INTRAVENOUS | Status: AC
  Administered 2024-02-21: 2 g via INTRAVENOUS
  Filled 2024-02-21: qty 100

## 2024-02-21 MED ORDER — MIDAZOLAM HCL 2 MG/2ML IJ SOLN
INTRAMUSCULAR | Status: AC
  Filled 2024-02-21: qty 2

## 2024-02-21 MED ORDER — FENTANYL CITRATE (PF) 250 MCG/5ML IJ SOLN
INTRAMUSCULAR | Status: AC
  Filled 2024-02-21: qty 5

## 2024-02-21 MED ORDER — PROPOFOL 10 MG/ML IV BOLUS
INTRAVENOUS | Status: AC
Start: 2024-02-21 — End: 2024-02-21
  Filled 2024-02-21: qty 20

## 2024-02-21 MED ORDER — METHOCARBAMOL 500 MG PO TABS: 500.0000 mg | ORAL_TABLET | Freq: Four times a day (QID) | ORAL | Status: DC | PRN

## 2024-02-21 MED ORDER — LACTATED RINGERS IV SOLN: INTRAVENOUS | Status: DC

## 2024-02-21 MED ORDER — HYDROMORPHONE HCL 1 MG/ML IJ SOLN
0.5000 mg | INTRAMUSCULAR | Status: DC | PRN
  Administered 2024-02-21: 1 mg via INTRAVENOUS
  Filled 2024-02-21: qty 1

## 2024-02-21 MED ORDER — ALUM & MAG HYDROXIDE-SIMETH 200-200-20 MG/5ML PO SUSP: 30.0000 mL | ORAL | Status: DC | PRN

## 2024-02-21 MED ORDER — POVIDONE-IODINE 10 % EX SWAB: 2.0000 | Freq: Once | CUTANEOUS | Status: DC

## 2024-02-21 MED ORDER — CEFAZOLIN SODIUM-DEXTROSE 2-4 GM/100ML-% IV SOLN
2.0000 g | Freq: Four times a day (QID) | INTRAVENOUS | Status: AC
  Administered 2024-02-21 – 2024-02-22 (×2): 2 g via INTRAVENOUS
  Filled 2024-02-21 (×2): qty 100

## 2024-02-21 MED ORDER — MENTHOL 3 MG MT LOZG: 1.0000 | LOZENGE | OROMUCOSAL | Status: DC | PRN

## 2024-02-21 MED ORDER — ROCURONIUM BROMIDE 10 MG/ML (PF) SYRINGE
PREFILLED_SYRINGE | INTRAVENOUS | Status: DC | PRN
  Administered 2024-02-21: 60 mg via INTRAVENOUS

## 2024-02-21 MED ORDER — ONDANSETRON HCL 4 MG/2ML IJ SOLN
INTRAMUSCULAR | Status: DC | PRN
  Administered 2024-02-21: 4 mg via INTRAVENOUS

## 2024-02-21 MED ORDER — ASPIRIN 81 MG PO CHEW
81.0000 mg | CHEWABLE_TABLET | Freq: Two times a day (BID) | ORAL | Status: DC
  Administered 2024-02-21 – 2024-02-24 (×6): 81 mg via ORAL
  Filled 2024-02-21 (×6): qty 1

## 2024-02-21 MED ORDER — METHOCARBAMOL 1000 MG/10ML IJ SOLN
500.0000 mg | Freq: Four times a day (QID) | INTRAMUSCULAR | Status: DC | PRN
Start: 2024-02-21 — End: 2024-02-24

## 2024-02-21 MED ORDER — SODIUM CHLORIDE 0.9 % IV SOLN: INTRAVENOUS | Status: DC

## 2024-02-21 MED ORDER — ONDANSETRON HCL 4 MG PO TABS: 4.0000 mg | ORAL_TABLET | Freq: Four times a day (QID) | ORAL | Status: DC | PRN

## 2024-02-21 MED ORDER — FENTANYL CITRATE (PF) 250 MCG/5ML IJ SOLN
INTRAMUSCULAR | Status: DC | PRN
  Administered 2024-02-21: 50 ug via INTRAVENOUS
  Administered 2024-02-21: 100 ug via INTRAVENOUS
  Administered 2024-02-21: 50 ug via INTRAVENOUS

## 2024-02-21 MED ORDER — FENTANYL CITRATE (PF) 100 MCG/2ML IJ SOLN
25.0000 ug | INTRAMUSCULAR | Status: DC | PRN
  Administered 2024-02-21 (×3): 50 ug via INTRAVENOUS

## 2024-02-21 MED ORDER — DOCUSATE SODIUM 100 MG PO CAPS
100.0000 mg | ORAL_CAPSULE | Freq: Two times a day (BID) | ORAL | Status: DC
  Administered 2024-02-21 – 2024-02-24 (×6): 100 mg via ORAL
  Filled 2024-02-21 (×7): qty 1

## 2024-02-21 MED ORDER — ORAL CARE MOUTH RINSE: 15.0000 mL | Freq: Once | OROMUCOSAL | Status: DC

## 2024-02-21 MED ORDER — DEXAMETHASONE SOD PHOSPHATE PF 10 MG/ML IJ SOLN
INTRAMUSCULAR | Status: DC | PRN
  Administered 2024-02-21: 4 mg via INTRAVENOUS

## 2024-02-21 MED ORDER — SUGAMMADEX SODIUM 200 MG/2ML IV SOLN
INTRAVENOUS | Status: DC | PRN
  Administered 2024-02-21: 200 mg via INTRAVENOUS

## 2024-02-21 MED ORDER — OXYCODONE HCL 5 MG PO TABS: 5.0000 mg | ORAL_TABLET | Freq: Once | ORAL | Status: DC | PRN

## 2024-02-21 MED ORDER — ONDANSETRON HCL 4 MG/2ML IJ SOLN: 4.0000 mg | Freq: Four times a day (QID) | INTRAMUSCULAR | Status: DC | PRN

## 2024-02-21 MED ORDER — PHENYLEPHRINE 80 MCG/ML (10ML) SYRINGE FOR IV PUSH (FOR BLOOD PRESSURE SUPPORT)
PREFILLED_SYRINGE | INTRAVENOUS | Status: DC | PRN
  Administered 2024-02-21: 80 ug via INTRAVENOUS
  Administered 2024-02-21: 120 ug via INTRAVENOUS

## 2024-02-21 MED ORDER — ALBUTEROL SULFATE HFA 108 (90 BASE) MCG/ACT IN AERS: 1.0000 | INHALATION_SPRAY | Freq: Four times a day (QID) | RESPIRATORY_TRACT | Status: DC | PRN

## 2024-02-21 MED ORDER — ALBUTEROL SULFATE (2.5 MG/3ML) 0.083% IN NEBU: 2.5000 mg | INHALATION_SOLUTION | Freq: Four times a day (QID) | RESPIRATORY_TRACT | Status: DC | PRN

## 2024-02-21 MED ORDER — PHENOL 1.4 % MT LIQD
1.0000 | OROMUCOSAL | Status: DC | PRN
Start: 2024-02-21 — End: 2024-02-24

## 2024-02-21 MED ORDER — TRANEXAMIC ACID-NACL 1000-0.7 MG/100ML-% IV SOLN
1000.0000 mg | INTRAVENOUS | Status: AC
  Administered 2024-02-21: 1000 mg via INTRAVENOUS
  Filled 2024-02-21: qty 100

## 2024-02-21 MED ORDER — FENTANYL CITRATE (PF) 100 MCG/2ML IJ SOLN
INTRAMUSCULAR | Status: AC
  Filled 2024-02-21: qty 2

## 2024-02-21 MED ORDER — OXYCODONE HCL 5 MG/5ML PO SOLN: 5.0000 mg | Freq: Once | ORAL | Status: DC | PRN

## 2024-02-21 MED ORDER — ONDANSETRON HCL 4 MG/2ML IJ SOLN: 4.0000 mg | Freq: Once | INTRAMUSCULAR | Status: DC | PRN

## 2024-02-21 MED ORDER — PROPOFOL 10 MG/ML IV BOLUS
INTRAVENOUS | Status: DC | PRN
  Administered 2024-02-21: 150 mg via INTRAVENOUS

## 2024-02-21 MED ORDER — OXYCODONE HCL 5 MG PO TABS
5.0000 mg | ORAL_TABLET | ORAL | Status: DC | PRN
  Administered 2024-02-23 – 2024-02-24 (×2): 10 mg via ORAL
  Filled 2024-02-21 (×2): qty 2

## 2024-02-21 MED ORDER — CHLORHEXIDINE GLUCONATE 0.12 % MT SOLN
15.0000 mL | Freq: Once | OROMUCOSAL | Status: DC
  Filled 2024-02-21: qty 15

## 2024-02-21 MED ORDER — PANTOPRAZOLE SODIUM 40 MG PO TBEC
40.0000 mg | DELAYED_RELEASE_TABLET | Freq: Every day | ORAL | Status: DC
  Administered 2024-02-21 – 2024-02-24 (×4): 40 mg via ORAL
  Filled 2024-02-21 (×4): qty 1

## 2024-02-21 SURGICAL SUPPLY — 43 items
BAG COUNTER SPONGE SURGICOUNT (BAG) ×1 IMPLANT
BENZOIN TINCTURE PRP APPL 2/3 (GAUZE/BANDAGES/DRESSINGS) ×1 IMPLANT
BLADE CLIPPER SURG (BLADE) IMPLANT
BLADE SAW SGTL 18X1.27X75 (BLADE) ×1 IMPLANT
COVER SURGICAL LIGHT HANDLE (MISCELLANEOUS) ×1 IMPLANT
CUP SECTOR GRIPTON 50MM (Cup) IMPLANT
DRAPE C-ARM 42X72 X-RAY (DRAPES) ×1 IMPLANT
DRAPE STERI IOBAN 125X83 (DRAPES) ×1 IMPLANT
DRAPE U-SHAPE 47X51 STRL (DRAPES) ×3 IMPLANT
DRESSING AQUACEL AG SP 3.5X10 (GAUZE/BANDAGES/DRESSINGS) IMPLANT
DRSG AQUACEL AG ADV 3.5X10 (GAUZE/BANDAGES/DRESSINGS) ×1 IMPLANT
DURAPREP 26ML APPLICATOR (WOUND CARE) ×1 IMPLANT
ELECT BLADE 6.5 EXT (BLADE) IMPLANT
ELECTRODE BLDE 4.0 EZ CLN MEGD (MISCELLANEOUS) ×1 IMPLANT
ELECTRODE REM PT RTRN 9FT ADLT (ELECTROSURGICAL) ×1 IMPLANT
FACESHIELD WRAPAROUND OR TEAM (MASK) ×2 IMPLANT
GLOVE BIOGEL PI IND STRL 8 (GLOVE) ×2 IMPLANT
GLOVE ECLIPSE 8.0 STRL XLNG CF (GLOVE) ×1 IMPLANT
GLOVE ORTHO TXT STRL SZ7.5 (GLOVE) ×2 IMPLANT
GOWN STRL REUS W/ TWL LRG LVL3 (GOWN DISPOSABLE) ×2 IMPLANT
GOWN STRL REUS W/ TWL XL LVL3 (GOWN DISPOSABLE) ×2 IMPLANT
HEAD FEM STD 32X+1 STRL (Hips) IMPLANT
KIT BASIN OR (CUSTOM PROCEDURE TRAY) ×1 IMPLANT
KIT TURNOVER KIT B (KITS) ×1 IMPLANT
LINER ACET PNNCL PLUS4 NEUTRAL (Hips) IMPLANT
MANIFOLD NEPTUNE II (INSTRUMENTS) ×1 IMPLANT
PACK TOTAL JOINT (CUSTOM PROCEDURE TRAY) ×1 IMPLANT
PAD ARMBOARD POSITIONER FOAM (MISCELLANEOUS) ×1 IMPLANT
SET HNDPC FAN SPRY TIP SCT (DISPOSABLE) ×1 IMPLANT
SOLN 0.9% NACL POUR BTL 1000ML (IV SOLUTION) ×1 IMPLANT
SOLN STERILE WATER BTL 1000 ML (IV SOLUTION) ×2 IMPLANT
STAPLER SKIN PROX 35W (STAPLE) IMPLANT
STEM FEM SZ3 STD ACTIS (Stem) IMPLANT
STRIP CLOSURE SKIN 1/2X4 (GAUZE/BANDAGES/DRESSINGS) ×2 IMPLANT
SUT ETHIBOND NAB CT1 #1 30IN (SUTURE) ×1 IMPLANT
SUT ETHILON 2 0 FS 18 (SUTURE) IMPLANT
SUT MNCRL AB 4-0 PS2 18 (SUTURE) IMPLANT
SUT VIC AB 0 CT1 27XBRD ANBCTR (SUTURE) ×1 IMPLANT
SUT VIC AB 1 CT1 27XBRD ANBCTR (SUTURE) ×1 IMPLANT
SUT VIC AB 2-0 CT1 TAPERPNT 27 (SUTURE) ×1 IMPLANT
TOWEL GREEN STERILE (TOWEL DISPOSABLE) ×1 IMPLANT
TOWEL GREEN STERILE FF (TOWEL DISPOSABLE) ×1 IMPLANT
TRAY FOLEY W/BAG SLVR 16FR ST (SET/KITS/TRAYS/PACK) IMPLANT

## 2024-02-21 NOTE — Progress Notes (Signed)
 Pt arrived to 6 north room 31 from PACU. Alert and oriented x4. Pain level 7/10 in left surgical hip. Bed in lowest position, call light in reach, ed alarm on. All needs met at this time.

## 2024-02-21 NOTE — Op Note (Signed)
 Operative Note  Date of operation: 02/21/2023 Preoperative diagnosis: Left hip primary osteoarthritis Postoperative diagnosis: Same  Procedure: Left direct anterior total hip arthroplasty  Implants: Implant Name Type Inv. Item Serial No. Manufacturer Lot No. LRB No. Used Action  CUP SECTOR GRIPTON - [id number] Cup CUP SECTOR GRIPTON  [employer]PU[employer]TH[employer]  Left 1 Implanted  LINER ACET PNNCL PLUS4 NEUTRAL - [id number] Hips LINER ACET PNNCL PLUS4 NEUTRAL  DEPUY ORTHOPA[employer]  Left 1 Implanted  STEM FEM SZ3 STD ACTIS - [id number] Stem STEM FEM SZ3 STD ACTIS  DEPUY ORTHOPAEDICS [id number] Left 1 Implanted  HEAD FEM STD 32X+1 STRL - [id number] Hips HEAD FEM STD 32X+1 STRL  DEPUY ORTHOPAEDICS [id number] Left 1 Implanted   Surgeon: Lonni GRADE. Vernetta, MD Assistant: Alisa Gaskins, PA-C  Anesthesia: General EBL: 400 cc Antibiotics: IV Ancef Complications: None  Indications: The patient is a 73 year old morbidly obese female with debilitating arthritis involving her left hip.  Her hip x-rays show complete loss of joint space with large osteophytes around the hip and essentially bone-on-bone wear.  She has significant limitations in range of motion of the left hip and the left leg is shorter with time compared to her right hip that was replaced years ago up in [place] .  That hip is done well.  At this point her left hip pain is severe and it is daily.  It is 10 out of 10.  It is detrimentally affecting her mobility, her quality of life and her activities of daily living to the point she does wish to proceed with hip replacement left side and we agree with this as well given her x-ray findings combined with failure of conservative treatment and her clinical exam findings.  We did discuss in length in detail the risks of acute blood loss anemia, nerve and vessel injury, fracture, infection, DVT, dislocation, implant failure, leg length differences and wound healing issues.  She understands all  these are heightened given her obesity.  She understands also that our goals are hopefully decreased pain, improved mobility and improved quality of life.  Procedure description: After informed consent was obtained the appropriate left hip was marked, the patient was brought to the operating room and general anesthesia was obtained while she was on the stretcher.  We assessed her leg lengths and her left leg leg is actually rotated.  She does have peripheral edema in that left side a centimeter shorter than the right.  Traction boots were placed on both her feet and then she was placed supine on the Hana fracture table with a perineal post and placed in both legs in inline skeletal traction devices but no traction applied.  The left operative hip and pelvis were assessed radiographically.  The left hip was prepped and draped with DuraPrep and sterile drapes.  A timeout was called and she was identified as the correct patient to correct the left hip.  An incision was then made just inferior and posterior to the ASIS and carried slightly obliquely down the leg.  Dissection was carried down to the tensor fascia lata muscle and the tensor fascia was then divided longitudinally to proceed with a direct intra approach the hip.  Circumflex vessels were identified and cauterized.  The hip capsule was identified and opened up in L-type format.  Cobra retractors were placed throughout the medial and lateral femoral neck and a femoral neck cut was made with an oscillating saw proximal to the lesser trochanter and this cut was completed with an  osteotome.  A corkscrew guide was placed in the femoral head and the femoral head was removed in its entirety and it was completely devoid of cartilage.  A bent Hohmann was then placed over the medial acetabular rim and remanence of the acetabular labrum and other debris removed.  Reaming was then initiated from a size 43 reamer and stepwise increments going up to a size 49 reamer with  all reamers placed under direct visualization the last reamer placed under microscopy in order to obtain the depth and reaming, the inclination and the anteversion.  The real DePuy sector GRIPTION acetabular component size 50 with the plate without difficulty followed by 36+4 poly liner.  Attention was then turned the femur.  The left leg was externally rotated to 120 degrees, extended and adducted, a Mueller retractors placed medially and a Hohmann tractor behind the greater trochanter.  The lateral capsule was released and a box cutting osteotome was used in the femoral canal.  Broaching was then initiated using the Actis broaching system from a size 0 going to a size 3.  With size 3 in place we trialed a standard offset femoral neck and a 32+1 trial head ball.  The left leg was brought over and up with traction and internal rotation reduced the pelvis and it was very tight and stable on clinical and radiographic exam.  We did increase her leg length.  We dislocated the hip and remove the trial components.  We then placed the real Actis femoral component with standard offset combined with a 32+1 metal head ball.  Again this reduced the pelvis and we assessed the radiographically and clinically and were placed.  We then irrigated soft tissue with normal saline solution.  She remnants of the joint capsule were closed with interrupted #1 Ethibond suture followed by #1 Vicryl close tensor fascia.  0 Vicryl was used to close the deep tissue and 2-0 Vicryl was used to close subcutaneous tissue and interrupted 2-0 nylon sutures to close the skin.  Xeroform and a well-padded sterile dressing was applied.  She was taken off the Hana table, awakened, extubated and taken the recovery room.  Tory Gaskins, PA-C did assist during the entire case and beginning to end and his assistance was medically necessary and warranted for soft tissue management and retraction, helping guide implant placement and a layered closure of the  wound.

## 2024-02-21 NOTE — Progress Notes (Signed)
 Food tray ordered for pt

## 2024-02-21 NOTE — Anesthesia Postprocedure Evaluation (Signed)
 Anesthesia Post Note  Patient: Angela Patton  Procedure(s) Performed: ARTHROPLASTY, HIP, TOTAL, ANTERIOR APPROACH (Left: Hip)     Patient location during evaluation: PACU Anesthesia Type: General Level of consciousness: awake and alert and oriented Pain management: pain level controlled Vital Signs Assessment: post-procedure vital signs reviewed and stable Respiratory status: spontaneous breathing, nonlabored ventilation and respiratory function stable Cardiovascular status: blood pressure returned to baseline and stable Postop Assessment: no apparent nausea or vomiting Anesthetic complications: no   No notable events documented.  Last Vitals:  Vitals:   02/21/24 1336 02/21/24 1345  BP:  138/76  Pulse: 71 70  Resp: 15 14  Temp:    SpO2: 98% 100%    Last Pain:  Vitals:   02/21/24 1345  PainSc: Asleep                 Aila Terra A.

## 2024-02-21 NOTE — Interval H&P Note (Signed)
 History and Physical Interval Note: The patient understands that she is here today for a left total hip replacement due to her significant left hip pain and arthritis.  There has been no acute or interval change in her medical status.  The risks and benefits of surgery have been discussed in detail and informed consent has been obtained.  The left operative hip has been marked.  02/21/2024 9:12 AM  Angela Patton  has presented today for surgery, with the diagnosis of osteoarthritis left hip.  The various methods of treatment have been discussed with the patient and family. After consideration of risks, benefits and other options for treatment, the patient has consented to  Procedure(s): ARTHROPLASTY, HIP, TOTAL, ANTERIOR APPROACH (Left) as a surgical intervention.  The patient's history has been reviewed, patient examined, no change in status, stable for surgery.  I have reviewed the patient's chart and labs.  Questions were answered to the patient's satisfaction.     Angela Patton

## 2024-02-21 NOTE — Discharge Instructions (Signed)

## 2024-02-21 NOTE — Anesthesia Procedure Notes (Signed)
 Procedure Name: Intubation Date/Time: 02/21/2024 10:56 AM  Performed by: Lockie Flesher, CRNAPre-anesthesia Checklist: Patient identified, Emergency Drugs available, Suction available and Patient being monitored Patient Re-evaluated:Patient Re-evaluated prior to induction Oxygen Delivery Method: Circle System Utilized Preoxygenation: Pre-oxygenation with 100% oxygen Induction Type: IV induction Ventilation: Mask ventilation without difficulty Laryngoscope Size: Mac and 3 Grade View: Grade I Tube type: Oral Tube size: 7.0 mm Number of attempts: 1 Airway Equipment and Method: Stylet and Oral airway Placement Confirmation: ETT inserted through vocal cords under direct vision, positive ETCO2 and breath sounds checked- equal and bilateral Secured at: 20 cm Tube secured with: Tape Dental Injury: Teeth and Oropharynx as per pre-operative assessment

## 2024-02-21 NOTE — Transfer of Care (Signed)
 Immediate Anesthesia Transfer of Care Note  Patient: Angela Patton  Procedure(s) Performed: ARTHROPLASTY, HIP, TOTAL, ANTERIOR APPROACH (Left: Hip)  Patient Location: PACU  Anesthesia Type:General  Level of Consciousness: awake, alert , and oriented  Airway & Oxygen Therapy: Patient Spontanous Breathing and Patient connected to nasal cannula oxygen  Post-op Assessment: Report given to RN and Post -op Vital signs reviewed and stable  Post vital signs: Reviewed and stable  Last Vitals:  Vitals Value Taken Time  BP 129/69 02/21/24 12:56  Temp    Pulse    Resp 19 02/21/24 13:00  SpO2    Vitals shown include unfiled device data.  Last Pain:  Vitals:   02/21/24 0840  PainSc: 0-No pain         Complications: No notable events documented.

## 2024-02-21 NOTE — Evaluation (Signed)
 Physical Therapy Evaluation Patient Details Name: Angela Patton MRN: [id number] DOB: [birth year] Today's Date: 02/21/2024  History of Present Illness  73 y.o. female presents to [facility] hospital on 02/21/2024 for elective L THA. PMH includes HTN, CAD, cancer, HLD, CKD III.  Clinical Impression  Pt presents to PT with deficits in functional mobility, gait, balance, strength, ROM. Pt is able to ambulate for short household distances with support of the RW. PT provides education on the THA exercise packet and encourages frequent mobilization with staff assistance. PT will follow up tomorrow for a progression of gait and functional mobility.        If plan is discharge home, recommend the following: A little help with walking and/or transfers;A little help with bathing/dressing/bathroom;Assistance with cooking/housework;Assist for transportation;Help with stairs or ramp for entrance   Can travel by private vehicle        Equipment Recommendations BSC/3in1;Rolling walker (2 wheels)  Recommendations for Other Services       Functional Status Assessment Patient has had a recent decline in their functional status and demonstrates the ability to make significant improvements in function in a reasonable and predictable amount of time.     Precautions / Restrictions Precautions Precautions: Fall Recall of Precautions/Restrictions: Intact Precaution/Restrictions Comments: direct anterior THA Restrictions Weight Bearing Restrictions Per Provider Order: Yes LLE Weight Bearing Per Provider Order: Weight bearing as tolerated      Mobility  Bed Mobility Overal bed mobility: Needs Assistance Bed Mobility: Rolling, Sidelying to Sit, Sit to Supine Rolling: Contact guard assist Sidelying to sit: Min assist, Used rails   Sit to supine: Min assist   General bed mobility comments: increased time    Transfers Overall transfer level: Needs assistance Equipment used: Rolling walker (2  wheels) Transfers: Sit to/from Stand Sit to Stand: Contact guard assist                Ambulation/Gait Ambulation/Gait assistance: Contact guard assist Gait Distance (Feet): 50 Feet Assistive device: Rolling walker (2 wheels) Gait Pattern/deviations: Step-to pattern Gait velocity: reduced Gait velocity interpretation: <1.31 ft/sec, indicative of household ambulator   General Gait Details: slowed step-to gait, LLE externally rotated, pt is able to reduce the degree of ER with verbal cues but LLE tends to swing back into ER when stepping  Stairs            Wheelchair Mobility     Tilt Bed    Modified Rankin (Stroke Patients Only)       Balance Overall balance assessment: Needs assistance Sitting-balance support: No upper extremity supported, Feet supported Sitting balance-Leahy Scale: Fair     Standing balance support: Bilateral upper extremity supported, Reliant on assistive device for balance Standing balance-Leahy Scale: Poor                               Pertinent Vitals/Pain Pain Assessment Pain Assessment: 0-10 Pain Score: 8  Pain Location: L hip Pain Descriptors / Indicators: Sore Pain Intervention(s): Monitored during session    Home Living Family/patient expects to be discharged to:: Private residence Living Arrangements: Children;Other relatives;Non-relatives/Friends;Spouse/significant other Available Help at Discharge: Family;Available PRN/intermittently Type of Home: House Home Access: Ramped entrance       Home Layout: One level Home Equipment: Rollator (4 wheels);Toilet riser      Prior Function Prior Level of Function : Independent/Modified Independent             Mobility Comments: ambulatory for  household distances with rollator       Extremity/Trunk Assessment   Upper Extremity Assessment Upper Extremity Assessment: Overall WFL for tasks assessed    Lower Extremity Assessment Lower Extremity  Assessment: LLE deficits/detail LLE Deficits / Details: generalized post-op weakness. Pt with chronic LLE external rotation when mobilizing, daughter reports the pt typically rests with L hip externally rotated LLE Sensation: decreased light touch    Cervical / Trunk Assessment Cervical / Trunk Assessment: Other exceptions Cervical / Trunk Exceptions: body habitus  Communication   Communication Communication: No apparent difficulties    Cognition Arousal: Alert Behavior During Therapy: WFL for tasks assessed/performed   PT - Cognitive impairments: No apparent impairments                         Following commands: Intact       Cueing Cueing Techniques: Verbal cues     General Comments General comments (skin integrity, edema, etc.): VSS on RA    Exercises     Assessment/Plan    PT Assessment Patient needs continued PT services  PT Problem List Decreased strength;Decreased activity tolerance;Decreased balance;Decreased mobility;Decreased knowledge of use of DME;Decreased safety awareness;Decreased knowledge of precautions;Pain       PT Treatment Interventions DME instruction;Gait training;Functional mobility training;Therapeutic activities;Therapeutic exercise;Balance training;Neuromuscular re-education;Patient/family education;Wheelchair mobility training    PT Goals (Current goals can be found in the Care Plan section)  Acute Rehab PT Goals Patient Stated Goal: to return to independence, prior level of function PT Goal Formulation: With patient/family Time For Goal Achievement: 02/25/24 Potential to Achieve Goals: Good    Frequency 7X/week     Co-evaluation               AM-PAC PT 6 Clicks Mobility  Outcome Measure Help needed turning from your back to your side while in a flat bed without using bedrails?: A Little Help needed moving from lying on your back to sitting on the side of a flat bed without using bedrails?: A Little Help needed  moving to and from a bed to a chair (including a wheelchair)?: A Little Help needed standing up from a chair using your arms (e.g., wheelchair or bedside chair)?: A Little Help needed to walk in hospital room?: A Little Help needed climbing 3-5 steps with a railing? : Total 6 Click Score: 16    End of Session Equipment Utilized During Treatment: Gait belt Activity Tolerance: Patient tolerated treatment well Patient left: in bed;with call bell/phone within reach;with bed alarm set Nurse Communication: Mobility status PT Visit Diagnosis: Other abnormalities of gait and mobility (R26.89);Muscle weakness (generalized) (M62.81);Pain Pain - Right/Left: Left Pain - part of body: Hip    Time: 8282-8194 PT Time Calculation (min) (ACUTE ONLY): 48 min   Charges:   PT Evaluation $PT Eval Low Complexity: 1 Low PT Treatments $Therapeutic Activity: 8-22 mins PT General Charges $$ ACUTE PT VISIT: 1 Visit         Bernardino JINNY Ruth, PT, DPT Acute Rehabilitation Office [phone]   Bernardino JINNY Ruth 02/21/2024, 6:17 PM

## 2024-02-22 DIAGNOSIS — M1612 Unilateral primary osteoarthritis, left hip: Secondary | ICD-10-CM | POA: Diagnosis not present

## 2024-02-22 LAB — CBC
HCT: 27.3 % — ABNORMAL LOW (ref 36.0–46.0)
Hemoglobin: 8.6 g/dL — ABNORMAL LOW (ref 12.0–15.0)
MCH: 29.1 pg (ref 26.0–34.0)
MCHC: 31.5 g/dL (ref 30.0–36.0)
MCV: 92.2 fL (ref 80.0–100.0)
Platelets: 179 K/uL (ref 150–400)
RBC: 2.96 MIL/uL — ABNORMAL LOW (ref 3.87–5.11)
RDW: 13.2 % (ref 11.5–15.5)
WBC: 8.3 K/uL (ref 4.0–10.5)
nRBC: 0 % (ref 0.0–0.2)

## 2024-02-22 LAB — BASIC METABOLIC PANEL WITH GFR
Anion gap: 11 (ref 5–15)
BUN: 17 mg/dL (ref 8–23)
CO2: 22 mmol/L (ref 22–32)
Calcium: 9.5 mg/dL (ref 8.9–10.3)
Chloride: 102 mmol/L (ref 98–111)
Creatinine, Ser: 1.3 mg/dL — ABNORMAL HIGH (ref 0.44–1.00)
GFR, Estimated: 43 mL/min — ABNORMAL LOW (ref >60)
Glucose, Bld: 110 mg/dL — ABNORMAL HIGH (ref 70–99)
Potassium: 4.5 mmol/L (ref 3.5–5.1)
Sodium: 135 mmol/L (ref 135–145)

## 2024-02-22 MED ORDER — OXYCODONE HCL 5 MG PO TABS: 5.0000 mg | ORAL_TABLET | Freq: Four times a day (QID) | ORAL | 0 refills | Status: DC | PRN

## 2024-02-22 MED ORDER — ASPIRIN 81 MG PO CHEW: 81.0000 mg | CHEWABLE_TABLET | Freq: Two times a day (BID) | ORAL | 0 refills | Status: DC

## 2024-02-22 MED ORDER — METHOCARBAMOL 500 MG PO TABS: 500.0000 mg | ORAL_TABLET | Freq: Four times a day (QID) | ORAL | 1 refills | Status: DC | PRN

## 2024-02-22 NOTE — Plan of Care (Signed)
  Problem: Pain Managment: Goal: General experience of comfort will improve and/or be controlled Outcome: Progressing   Problem: Safety: Goal: Ability to remain free from injury will improve Outcome: Progressing   Problem: Skin Integrity: Goal: Risk for impaired skin integrity will decrease Outcome: Progressing

## 2024-02-22 NOTE — Progress Notes (Signed)
 Subjective: 1 Day Post-Op Procedure(s) (LRB): ARTHROPLASTY, HIP, TOTAL, ANTERIOR APPROACH (Left) Patient reports pain as moderate.  She has already been up and walk some.  She feels like there may be a leg length difference but she has significant difference before surgery and she is actually on more now in terms of her leg lengths.  She should feel that adjustment with time.  She does have acute on chronic blood loss anemia but her vital signs are stable.  She is not lightheaded.  Objective: Vital signs in last 24 hours: Temp:  [97.1 F (36.2 C)-98.6 F (37 C)] 98.6 F (37 C) (10/29 9378) Pulse Rate:  [69-98] 98 (10/29 0621) Resp:  [10-20] 14 (10/29 0621) BP: (119-182)/(62-97) 158/64 (10/29 0621) SpO2:  [93 %-100 %] 97 % (10/29 0621) Weight:  [91.8 kg] 91.8 kg (10/28 0823)  Intake/Output from previous day: 10/28 0701 - 10/29 0700 In: 1922.2 [P.O.:240; I.V.:1301.7; IV Piggyback:380.5] Out: 400 [Blood:400] Intake/Output this shift: No intake/output data recorded.  Recent Labs    02/22/24 0436  HGB 8.6*   Recent Labs    02/22/24 0436  WBC 8.3  RBC 2.96*  HCT 27.3*  PLT 179   Recent Labs    02/22/24 0436  NA 135  K 4.5  CL 102  CO2 22  BUN 17  CREATININE 1.30*  GLUCOSE 110*  CALCIUM 9.5   No results for input(s): LABPT, INR in the last 72 hours.  Sensation intact distally Intact pulses distally Dorsiflexion/Plantar flexion intact Incision: scant drainage   Assessment/Plan: 1 Day Post-Op Procedure(s) (LRB): ARTHROPLASTY, HIP, TOTAL, ANTERIOR APPROACH (Left) Up with therapy  There is potential for discharge home this afternoon if she is making good progress.    Angela Patton 02/22/2024, 7:26 AM

## 2024-02-22 NOTE — Evaluation (Signed)
 Occupational Therapy Evaluation Patient Details Name: Angela Patton MRN: [id number] DOB: [birth year] Today's Date: 02/22/2024   History of Present Illness   73 y.o. female presents to [facility] hospital on 02/21/2024 for elective L THA. PMH includes HTN, CAD, cancer, HLD, CKD III.     Clinical Impressions Patient reports that her daughter , SIL and her husband in a 2 level home a;although stays on main level and reports being  Independent for ADLs and competes functional mobility wit a rollator at baseline.  Her daughter completes most IADLs although reports that she can't help at times..  Patient completed sit to stand from bedside chair  with Mod A  to power up to standing position and minA  for function al mobility with RW and toilet transfer with minA .  Max A for LB ADLs 2/2 left hip pain. Patient would benefit from additional OT intervention to address functional deficits of ADLs, activity tolerance, safety, fall prevention, AE education and UE strength.  OT will continue to follow acutely and will recommend HHOT to address any remaining deficits and to ensure a safe transition back home.     If plan is discharge home, recommend the following:   A little help with walking and/or transfers;A lot of help with bathing/dressing/bathroom;Assist for transportation;Help with stairs or ramp for entrance;Direct supervision/assist for medications management;Direct supervision/assist for financial management;Assistance with cooking/housework     Functional Status Assessment   Patient has had a recent decline in their functional status and demonstrates the ability to make significant improvements in function in a reasonable and predictable amount of time.     Equipment Recommendations   Tub/shower seat     Recommendations for Other Services         Precautions/Restrictions   Precautions Precautions: Fall Recall of Precautions/Restrictions: Intact Precaution/Restrictions Comments:  direct anterior THA Restrictions Weight Bearing Restrictions Per Provider Order: Yes LLE Weight Bearing Per Provider Order: Weight bearing as tolerated     Mobility Bed Mobility Overal bed mobility: Needs Assistance Bed Mobility: Sit to Supine       Sit to supine: Mod assist (to lift LEs into bed)        Transfers Overall transfer level: Needs assistance Equipment used: Rolling walker (2 wheels) Transfers: Sit to/from Stand Sit to Stand: Min assist                  Balance Overall balance assessment: Needs assistance Sitting-balance support: No upper extremity supported, Feet supported Sitting balance-Leahy Scale: Fair     Standing balance support: Bilateral upper extremity supported, Reliant on assistive device for balance Standing balance-Leahy Scale: Poor Standing balance comment: reliant on UE support                           ADL either performed or assessed with clinical judgement   ADL Overall ADL's : Needs assistance/impaired Eating/Feeding: Independent;Sitting   Grooming: Wash/dry hands;Wash/dry face;Set up;Sitting   Upper Body Bathing: Set up;Sitting   Lower Body Bathing: Maximal assistance   Upper Body Dressing : Set up;Sitting   Lower Body Dressing: Maximal assistance   Toilet Transfer: Minimal assistance   Toileting- Clothing Manipulation and Hygiene: Moderate assistance       Functional mobility during ADLs: Minimal assistance;Rolling walker (2 wheels)       Vision Patient Visual Report: No change from baseline Vision Assessment?: No apparent visual deficits     Perception Perception: Within Functional Limits  Praxis         Pertinent Vitals/Pain Pain Assessment Pain Assessment: 0-10 Pain Score: 6  Faces Pain Scale: Hurts even more Pain Location: L hip Pain Descriptors / Indicators: Sore Pain Intervention(s): Monitored during session     Extremity/Trunk Assessment Upper Extremity Assessment Upper  Extremity Assessment: Overall WFL for tasks assessed   Lower Extremity Assessment Lower Extremity Assessment: Defer to PT evaluation   Cervical / Trunk Assessment Cervical / Trunk Assessment: Other exceptions Cervical / Trunk Exceptions: body habitus   Communication Communication Communication: No apparent difficulties   Cognition Arousal: Alert Behavior During Therapy: WFL for tasks assessed/performed Cognition: No apparent impairments                               Following commands: Intact       Cueing  General Comments   Cueing Techniques: Verbal cues  VSS on RA   Exercises     Shoulder Instructions      Home Living Family/patient expects to be discharged to:: Private residence Living Arrangements: Children;Other relatives;Non-relatives/Friends;Spouse/significant other Available Help at Discharge: Family;Available PRN/intermittently Type of Home: House Home Access: Ramped entrance     Home Layout: One level     Bathroom Shower/Tub: Producer, Television/film/video: Standard Bathroom Accessibility: Yes How Accessible: Accessible via walker Home Equipment: Rollator (4 wheels);Toilet riser          Prior Functioning/Environment Prior Level of Function : Independent/Modified Independent             Mobility Comments: ambulatory for household distances with rollator      OT Problem List: Decreased strength;Decreased activity tolerance;Decreased safety awareness;Impaired balance (sitting and/or standing)   OT Treatment/Interventions: Self-care/ADL training;Therapeutic exercise;DME and/or AE instruction;Therapeutic activities;Patient/family education      OT Goals(Current goals can be found in the care plan section)   Acute Rehab OT Goals OT Goal Formulation: With patient Time For Goal Achievement: 03/07/24 Potential to Achieve Goals: Good ADL Goals Pt Will Perform Lower Body Bathing: with adaptive equipment;with min assist Pt  Will Perform Lower Body Dressing: with mod assist;with adaptive equipment Pt Will Transfer to Toilet: with contact guard assist Pt Will Perform Toileting - Clothing Manipulation and hygiene: with min assist Pt Will Perform Tub/Shower Transfer: with min assist   OT Frequency:  Min 2X/week    Co-evaluation              AM-PAC OT 6 Clicks Daily Activity     Outcome Measure Help from another person eating meals?: None Help from another person taking care of personal grooming?: A Little Help from another person toileting, which includes using toliet, bedpan, or urinal?: A Lot Help from another person bathing (including washing, rinsing, drying)?: A Lot Help from another person to put on and taking off regular upper body clothing?: A Little Help from another person to put on and taking off regular lower body clothing?: A Lot 6 Click Score: 16   End of Session Equipment Utilized During Treatment: Rolling walker (2 wheels) Nurse Communication: Mobility status  Activity Tolerance: Patient tolerated treatment well Patient left: in bed;with call bell/phone within reach;with nursing/sitter in room;with family/visitor present  OT Visit Diagnosis: Unsteadiness on feet (R26.81);Muscle weakness (generalized) (M62.81)                Time: 8941-8844 OT Time Calculation (min): 57 min Charges:  OT General Charges $OT Visit: 1 Visit OT Evaluation $OT  Eval Moderate Complexity: 1 Mod OT Treatments $Self Care/Home Management : 38-52 mins  Angela Patton OT/L  Angela Patton 02/22/2024, 5:37 PM

## 2024-02-22 NOTE — Progress Notes (Signed)
 Physical Therapy Treatment Patient Details Name: Angela Patton MRN: [id number] DOB: 1951/02/06 Today's Date: 02/22/2024   History of Present Illness 73 y.o. female presents to [facility] hospital on 02/21/2024 for elective L THA. PMH includes HTN, CAD, cancer, HLD, CKD III.    PT Comments  PM session: Pt resting in bed on arrival, agreeable to session with encouragement as pt endorsing increased fatigue this PM. Pt continues to require increased time to complete bed mobility however pt able to self mobilize LLE to and off EOB with gait belt as leg lifter without external assistance. Pt demonstrating x2 short gait bouts with CGA for safety, with distance limited to pt stated tolerance due to fatigue. Pt requesting to return to supine at end of session and require min A to bring LLE up into bed. Pt continues to benefit from skilled PT services to progress toward functional mobility goals.     If plan is discharge home, recommend the following: A little help with walking and/or transfers;A little help with bathing/dressing/bathroom;Assistance with cooking/housework;Assist for transportation;Help with stairs or ramp for entrance   Can travel by private vehicle        Equipment Recommendations  BSC/3in1;Rolling walker (2 wheels)    Recommendations for Other Services       Precautions / Restrictions Precautions Precautions: Fall Recall of Precautions/Restrictions: Intact Precaution/Restrictions Comments: direct anterior THA Restrictions Weight Bearing Restrictions Per Provider Order: Yes LLE Weight Bearing Per Provider Order: Weight bearing as tolerated     Mobility  Bed Mobility Overal bed mobility: Needs Assistance Bed Mobility: Supine to Sit, Sit to Supine     Supine to sit: Min assist Sit to supine: Min assist   General bed mobility comments: min A to manage LEs, significantly increased time to compelte    Transfers Overall transfer level: Needs assistance Equipment used:  Rolling walker (2 wheels) Transfers: Sit to/from Stand Sit to Stand: Contact guard assist           General transfer comment: CGA for safety from slightly elevated EOB and BSC over commode, cues for hand placement, trunk flexed on rise    Ambulation/Gait Ambulation/Gait assistance: Contact guard assist Gait Distance (Feet): 18 Feet (x2) Assistive device: Rolling walker (2 wheels) Gait Pattern/deviations: Step-to pattern Gait velocity: reduced     General Gait Details: slowed step-to gait, LLE contineud to be slightly in external rotation throughout, cues for increased LLE clearance   Stairs             Wheelchair Mobility     Tilt Bed    Modified Rankin (Stroke Patients Only)       Balance Overall balance assessment: Needs assistance Sitting-balance support: No upper extremity supported, Feet supported Sitting balance-Leahy Scale: Fair     Standing balance support: Bilateral upper extremity supported, Reliant on assistive device for balance Standing balance-Leahy Scale: Poor Standing balance comment: reliant on UE support                            Communication Communication Communication: No apparent difficulties  Cognition Arousal: Alert Behavior During Therapy: WFL for tasks assessed/performed   PT - Cognitive impairments: No apparent impairments                         Following commands: Intact      Cueing Cueing Techniques: Verbal cues  Exercises      General Comments General comments (skin integrity,  edema, etc.): VSS on RA      Pertinent Vitals/Pain Pain Assessment Pain Assessment: Faces Faces Pain Scale: Hurts little more Pain Location: L hip Pain Descriptors / Indicators: Sore Pain Intervention(s): Monitored during session, Limited activity within patient's tolerance    Home Living                          Prior Function            PT Goals (current goals can now be found in the care plan  section) Acute Rehab PT Goals Patient Stated Goal: to return to independence, prior level of function PT Goal Formulation: With patient/family Time For Goal Achievement: 02/25/24 Progress towards PT goals: Progressing toward goals    Frequency    7X/week      PT Plan      Co-evaluation              AM-PAC PT 6 Clicks Mobility   Outcome Measure  Help needed turning from your back to your side while in a flat bed without using bedrails?: A Little Help needed moving from lying on your back to sitting on the side of a flat bed without using bedrails?: A Little Help needed moving to and from a bed to a chair (including a wheelchair)?: A Little Help needed standing up from a chair using your arms (e.g., wheelchair or bedside chair)?: A Little Help needed to walk in hospital room?: A Lot (<40') Help needed climbing 3-5 steps with a railing? : Total 6 Click Score: 15    End of Session   Activity Tolerance: Patient tolerated treatment well;Patient limited by fatigue Patient left: with call bell/phone within reach;in bed Nurse Communication: Mobility status PT Visit Diagnosis: Other abnormalities of gait and mobility (R26.89);Muscle weakness (generalized) (M62.81);Pain Pain - Right/Left: Left Pain - part of body: Hip     Time: [phone] (pt in bathroom for part of session, time not billed) PT Time Calculation (min) (ACUTE ONLY): 71 min  Charges:    $Gait Training: 8-22 mins $Therapeutic Activity: 8-22 mins PT General Charges $$ ACUTE PT VISIT: 1 Visit                     [name] R. PTA Acute Rehabilitation Services Office: [phone]   Therisa CHRISTELLA Boor 02/22/2024, 4:22 PM

## 2024-02-22 NOTE — TOC Initial Note (Addendum)
 Transition of Care (TOC) - Initial/Assessment Note   Spoke to patient at bedside.   DR Blackman's office arranged HHPT with [facility] , patient agreeable. Messaged [name] with [facility] . [name] has confirmed [facility] will provide HHPT   Orders for rolling walker and 3 in 1 . Patient states she has 3 in1 already.   Her husband is bed ridden and she uses hs rollator , she does not want NCM to order RW for her for home.   NCM messaged PT/OT and will see what PT/OT and patient wants after evaluation. Patient voiced understanding   1555 Patient has now worked with PT and agreeable to rolling walker. Rolling walker ordered with Jermaine with [employer]  Patient Details  Name: Angela Patton MRN: [id number] Date of Birth: [birth year]  Transition of Care [facility]) CM/SW Contact:    Stephane Powell Jansky, RN Phone Number: 02/22/2024, 8:21 AM  Clinical Narrative:                   Expected Discharge Plan: Home w Home Health Services Barriers to Discharge: Continued Medical Work up   Patient Goals and CMS Choice Patient states their goals for this hospitalization and ongoing recovery are:: to return to home CMS Medicare.gov Compare Post Acute Care list provided to:: Patient Choice offered to / list presented to : Patient      Expected Discharge Plan and Services   Discharge Planning Services: CM Consult Post Acute Care Choice: Home Health Living arrangements for the past 2 months: Single Family Home                 DME Arranged:  (see note)         HH Arranged: PT HH Agency: [facility] Date HH Agency Contacted: 02/22/24 Time HH Agency Contacted: [phone] Representative spoke with at [facility] Agency: Jerel [name] a message  Prior Living Arrangements/Services Living arrangements for the past 2 months: Single Family Home Lives with:: Relatives Patient language and need for interpreter reviewed:: Yes Do you feel safe going back to the place where you live?: Yes      Need for Family  Participation in Patient Care: Yes (Comment) Care giver support system in place?: Yes (comment) Current home services: DME Criminal Activity/Legal Involvement Pertinent to Current Situation/Hospitalization: No - Comment as needed  Activities of Daily Living   ADL Screening (condition at time of admission) Independently performs ADLs?: Yes (appropriate for developmental age) Is the patient deaf or have difficulty hearing?: No Does the patient have difficulty seeing, even when wearing glasses/contacts?: No Does the patient have difficulty concentrating, remembering, or making decisions?: No  Permission Sought/Granted   Permission granted to share information with : Yes, Verbal Permission Granted     Permission granted to share info w AGENCY: [facility]        Emotional Assessment Appearance:: Appears stated age Attitude/Demeanor/Rapport: Engaged Affect (typically observed): Appropriate Orientation: : Oriented to Self, Oriented to Place, Oriented to  Time, Oriented to Situation Alcohol / Substance Use: Not Applicable Psych Involvement: No (comment)  Admission diagnosis:  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] Patient Active Problem List   Diagnosis Date Noted   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   Obesity, morbid (HCC) 12/13/2023   Mass of left hip region 12/13/2023   Stage 3b chronic kidney disease (HCC) 12/13/2023   Unilateral primary osteoarthritis, left hip 12/13/2023   Left lower quadrant abdominal  pain 02/09/2017   Personal history of colon cancer 02/09/2017   Hypertension    Coronary artery disease    Cancer (HCC)    Hyperlipidemia    PCP:  Purcell Emil Schanz, MD Pharmacy:   [id number] - [place], [place] - [address] AT [facility] OF [address] & [facility] [address] [place] [address] Phone: [phone] Fax: [phone]     Social Drivers of Health  (SDOH) Social History: SDOH Screenings   Food Insecurity: No Food Insecurity (02/21/2024)  Housing: Unknown (02/21/2024)  Transportation Needs: No Transportation Needs (02/21/2024)  Utilities: Not At Risk (02/21/2024)  Alcohol Screen: Low Risk  (05/17/2023)  Depression (PHQ2-9): Low Risk  (12/13/2023)  Financial Resource Strain: Low Risk  (05/17/2023)  Physical Activity: Inactive (05/17/2023)  Social Connections: Socially Integrated (02/21/2024)  Stress: No Stress Concern Present (05/17/2023)  Tobacco Use: Low Risk  (02/21/2024)  Health Literacy: Adequate Health Literacy (05/17/2023)   SDOH Interventions:     Readmission Risk Interventions     No data to display

## 2024-02-22 NOTE — Progress Notes (Signed)
 Physical Therapy Treatment Patient Details Name: Angela Patton MRN: [id number] DOB: 09-Aug-1950 Today's Date: 02/22/2024   History of Present Illness 73 y.o. female presents to [facility] hospital on 02/21/2024 for elective L THA. PMH includes HTN, CAD, cancer, HLD, CKD III.    PT Comments  Pt resting in bed on arrival, agreeable to session and demonstrating steady progress towards acute goals. Pt needing significantly increased time to come to sitting EOB with min A needed to manage LLE to and off EOB and to elevate trunk. Pt demonstrating transfers sit<>stand with CGA for safety and demonstrating x2 gait bouts with RW for support and grossly CGA for safety with cues for increased clearance on L. Pt up in chair at end of session and verbalizing understanding of importance of frequent mobility to maximize functional mobility gains. Pt continues to benefit from skilled PT services to progress toward functional mobility goals.     If plan is discharge home, recommend the following: A little help with walking and/or transfers;A little help with bathing/dressing/bathroom;Assistance with cooking/housework;Assist for transportation;Help with stairs or ramp for entrance   Can travel by private vehicle        Equipment Recommendations  BSC/3in1;Rolling walker (2 wheels)    Recommendations for Other Services       Precautions / Restrictions Precautions Precautions: Fall Recall of Precautions/Restrictions: Intact Precaution/Restrictions Comments: direct anterior THA Restrictions Weight Bearing Restrictions Per Provider Order: Yes LLE Weight Bearing Per Provider Order: Weight bearing as tolerated     Mobility  Bed Mobility Overal bed mobility: Needs Assistance Bed Mobility: Supine to Sit     Supine to sit: Min assist     General bed mobility comments: min A to manage LEs and elevate trunk, significantly increased time to compelte    Transfers Overall transfer level: Needs  assistance Equipment used: Rolling walker (2 wheels) Transfers: Sit to/from Stand Sit to Stand: Contact guard assist           General transfer comment: CGA for safety from slightly elevated EOB and BSC over commode, cues for hand placement, trunk flexed on rise    Ambulation/Gait Ambulation/Gait assistance: Contact guard assist Gait Distance (Feet): 45 Feet (+15) Assistive device: Rolling walker (2 wheels) Gait Pattern/deviations: Step-to pattern Gait velocity: reduced     General Gait Details: slowed step-to gait, LLE externally rotated, pt is able to reduce the degree of ER with verbal cues but LLE tends to swing back into ER when stepping   Stairs             Wheelchair Mobility     Tilt Bed    Modified Rankin (Stroke Patients Only)       Balance Overall balance assessment: Needs assistance Sitting-balance support: No upper extremity supported, Feet supported Sitting balance-Leahy Scale: Fair     Standing balance support: Bilateral upper extremity supported, Reliant on assistive device for balance Standing balance-Leahy Scale: Poor Standing balance comment: reliant on UE support                            Communication Communication Communication: No apparent difficulties  Cognition Arousal: Alert Behavior During Therapy: WFL for tasks assessed/performed   PT - Cognitive impairments: No apparent impairments                         Following commands: Intact      Cueing Cueing Techniques: Verbal cues  Exercises  General Comments General comments (skin integrity, edema, etc.): VSS on RA      Pertinent Vitals/Pain Pain Assessment Pain Assessment: Faces Faces Pain Scale: Hurts little more Pain Location: L hip Pain Descriptors / Indicators: Sore Pain Intervention(s): Monitored during session, Limited activity within patient's tolerance, Repositioned    Home Living                          Prior  Function            PT Goals (current goals can now be found in the care plan section) Acute Rehab PT Goals Patient Stated Goal: to return to independence, prior level of function PT Goal Formulation: With patient/family Time For Goal Achievement: 02/25/24 Progress towards PT goals: Progressing toward goals    Frequency    7X/week      PT Plan      Co-evaluation              AM-PAC PT 6 Clicks Mobility   Outcome Measure  Help needed turning from your back to your side while in a flat bed without using bedrails?: A Little Help needed moving from lying on your back to sitting on the side of a flat bed without using bedrails?: A Little Help needed moving to and from a bed to a chair (including a wheelchair)?: A Little Help needed standing up from a chair using your arms (e.g., wheelchair or bedside chair)?: A Little Help needed to walk in hospital room?: A Little Help needed climbing 3-5 steps with a railing? : Total 6 Click Score: 16    End of Session   Activity Tolerance: Patient tolerated treatment well Patient left: with call bell/phone within reach;in chair Nurse Communication: Mobility status PT Visit Diagnosis: Other abnormalities of gait and mobility (R26.89);Muscle weakness (generalized) (M62.81);Pain Pain - Right/Left: Left Pain - part of body: Hip     Time: 9080-8985 PT Time Calculation (min) (ACUTE ONLY): 55 min  Charges:    $Gait Training: 23-37 mins $Therapeutic Activity: 8-22 mins PT General Charges $$ ACUTE PT VISIT: 1 Visit                     Chonita Gadea R. PTA Acute Rehabilitation Services Office: [phone]   Therisa CHRISTELLA Boor 02/22/2024, 1:12 PM

## 2024-02-23 ENCOUNTER — Encounter (HOSPITAL_COMMUNITY): Payer: Self-pay | Admitting: Orthopaedic Surgery

## 2024-02-23 DIAGNOSIS — M1612 Unilateral primary osteoarthritis, left hip: Secondary | ICD-10-CM | POA: Diagnosis not present

## 2024-02-23 LAB — CBC
HCT: 25.1 % — ABNORMAL LOW (ref 36.0–46.0)
Hemoglobin: 7.8 g/dL — ABNORMAL LOW (ref 12.0–15.0)
MCH: 28.9 pg (ref 26.0–34.0)
MCHC: 31.1 g/dL (ref 30.0–36.0)
MCV: 93 fL (ref 80.0–100.0)
Platelets: 171 K/uL (ref 150–400)
RBC: 2.7 MIL/uL — ABNORMAL LOW (ref 3.87–5.11)
RDW: 13.8 % (ref 11.5–15.5)
WBC: 10.8 K/uL — ABNORMAL HIGH (ref 4.0–10.5)
nRBC: 0 % (ref 0.0–0.2)

## 2024-02-23 NOTE — Progress Notes (Signed)
 Physical Therapy Treatment Patient Details Name: ADELI FROST MRN: [id number] DOB: 1950/07/09 Today's Date: 02/23/2024   History of Present Illness 73 y.o. female presents to [facility] hospital on 02/21/2024 for elective L THA. PMH includes HTN, CAD, cancer, HLD, CKD III.    PT Comments  Pt resting in bed on arrival, agreeable to session and with slow progress towards acute goals. Pt continues to require significantly increased time to complete bed mobility and to initiate transfer sit<>stand. Pt able to complete bed mobility and transfers with grossly CGA with cues for technique, sequencing and hand placement throughout. Pt declining ambulation trials this session and requesting increased time seated up on BSC at end of session. Call bell within reach and pt verbalizing understanding to call when finished. Plan to see pt in PM to continue to progress functional mobility as tolerated. Pt continues to benefit from skilled PT services to progress toward functional mobility goals.     If plan is discharge home, recommend the following: A little help with walking and/or transfers;A little help with bathing/dressing/bathroom;Assistance with cooking/housework;Assist for transportation;Help with stairs or ramp for entrance   Can travel by private vehicle        Equipment Recommendations  BSC/3in1;Rolling walker (2 wheels)    Recommendations for Other Services       Precautions / Restrictions Precautions Precautions: Fall Recall of Precautions/Restrictions: Intact Precaution/Restrictions Comments: direct anterior THA Restrictions Weight Bearing Restrictions Per Provider Order: Yes LLE Weight Bearing Per Provider Order: Weight bearing as tolerated     Mobility  Bed Mobility Overal bed mobility: Needs Assistance Bed Mobility: Supine to Sit     Supine to sit: Contact guard     General bed mobility comments: pt utilizing gait belt as leg lifter, able to come to EOB without assist with  increased time and use of bed features    Transfers Overall transfer level: Needs assistance Equipment used: Rolling walker (2 wheels) Transfers: Sit to/from Stand, Bed to chair/wheelchair/BSC Sit to Stand: Contact guard assist   Step pivot transfers: Contact guard assist       General transfer comment: CGA for safety from slightly elevated EOB, pt needing significantly increased time to initiate trasnfer to stand, CGA to step pivot EOB>BSC with cues for upright posture and safe RW use as pt letting go of RW and reaching for [facility] prior to arrival    Ambulation/Gait               General Gait Details: pt declining   Stairs             Wheelchair Mobility     Tilt Bed    Modified Rankin (Stroke Patients Only)       Balance Overall balance assessment: Needs assistance Sitting-balance support: No upper extremity supported, Feet supported Sitting balance-Leahy Scale: Fair     Standing balance support: Bilateral upper extremity supported, Reliant on assistive device for balance Standing balance-Leahy Scale: Poor Standing balance comment: reliant on UE support                            Communication Communication Communication: No apparent difficulties  Cognition Arousal: Alert Behavior During Therapy: WFL for tasks assessed/performed   PT - Cognitive impairments: No apparent impairments                         Following commands: Intact      Cueing Cueing Techniques:  Verbal cues  Exercises      General Comments General comments (skin integrity, edema, etc.): VSS on RA, pt stating she would need increased time on [facility], aware to call for assist back to bed when finished      Pertinent Vitals/Pain Pain Assessment Pain Assessment: Faces Faces Pain Scale: Hurts even more Pain Location: L hip Pain Descriptors / Indicators: Sore Pain Intervention(s): Limited activity within patient's tolerance, Monitored during session     Home Living                          Prior Function            PT Goals (current goals can now be found in the care plan section) Acute Rehab PT Goals Patient Stated Goal: to return to independence, prior level of function PT Goal Formulation: With patient/family Time For Goal Achievement: 02/25/24 Progress towards PT goals: Progressing toward goals    Frequency    7X/week      PT Plan      Co-evaluation              AM-PAC PT 6 Clicks Mobility   Outcome Measure  Help needed turning from your back to your side while in a flat bed without using bedrails?: A Little Help needed moving from lying on your back to sitting on the side of a flat bed without using bedrails?: A Little Help needed moving to and from a bed to a chair (including a wheelchair)?: A Little Help needed standing up from a chair using your arms (e.g., wheelchair or bedside chair)?: A Little Help needed to walk in hospital room?: A Lot (<40') Help needed climbing 3-5 steps with a railing? : Total 6 Click Score: 15    End of Session   Activity Tolerance: Patient tolerated treatment well;Patient limited by fatigue Patient left: with call bell/phone within reach;Other (comment) (on [facility]) Nurse Communication: Mobility status PT Visit Diagnosis: Other abnormalities of gait and mobility (R26.89);Muscle weakness (generalized) (M62.81);Pain Pain - Right/Left: Left Pain - part of body: Hip     Time: 1041-1105 PT Time Calculation (min) (ACUTE ONLY): 24 min  Charges:    $Therapeutic Activity: 23-37 mins PT General Charges $$ ACUTE PT VISIT: 1 Visit                     Ashlynd Michna R. PTA Acute Rehabilitation Services Office: [phone]   Therisa CHRISTELLA Boor 02/23/2024, 1:13 PM

## 2024-02-23 NOTE — Plan of Care (Signed)
  Problem: Pain Managment: Goal: General experience of comfort will improve and/or be controlled Outcome: Progressing   Problem: Safety: Goal: Ability to remain free from injury will improve Outcome: Progressing   Problem: Skin Integrity: Goal: Risk for impaired skin integrity will decrease Outcome: Progressing

## 2024-02-23 NOTE — Progress Notes (Signed)
 Physical Therapy Treatment Patient Details Name: Angela Patton MRN: [id number] DOB: Sep 26, 1950 Today's Date: 02/23/2024   History of Present Illness 73 y.o. female presents to [facility] hospital on 02/21/2024 for elective L THA. PMH includes HTN, CAD, cancer, HLD, CKD III.    PT Comments  Pt up on commode in bathroom on arrival, agreeable to session and demonstrating slow progress towards acute goals. Pt agreeable to gait trial this session with pt needing significantly increased time (~97mins) to ambulate 10' with RW for support and max verbal and tactile cues for sequencing. Pt requiring mod A to stand from low commode and min A for eccentric control to sitting in recliner at end of session with pt needing significantly increased time for motor planning of all transfers. Pt daughter present and supportive and endorsing ability to provide assist level pt currently needing. Pt continues to benefit from skilled PT services to progress toward functional mobility goals.     If plan is discharge home, recommend the following: A little help with walking and/or transfers;A little help with bathing/dressing/bathroom;Assistance with cooking/housework;Assist for transportation;Help with stairs or ramp for entrance   Can travel by private vehicle        Equipment Recommendations  BSC/3in1;Rolling walker (2 wheels)    Recommendations for Other Services       Precautions / Restrictions Precautions Precautions: Fall Recall of Precautions/Restrictions: Intact Precaution/Restrictions Comments: direct anterior THA Restrictions Weight Bearing Restrictions Per Provider Order: Yes LLE Weight Bearing Per Provider Order: Weight bearing as tolerated     Mobility  Bed Mobility Overal bed mobility: Needs Assistance Bed Mobility: Supine to Sit     Supine to sit: Contact guard     General bed mobility comments: pt up on commode on arrival    Transfers Overall transfer level: Needs assistance Equipment  used: Rolling walker (2 wheels) Transfers: Sit to/from Stand, Bed to chair/wheelchair/BSC Sit to Stand: Mod assist   Step pivot transfers: Contact guard assist       General transfer comment: mod A to stand from low commode with pt needing increased time to initiate and complete    Ambulation/Gait Ambulation/Gait assistance: Contact guard assist Gait Distance (Feet): 10 Feet Assistive device: Rolling walker (2 wheels) Gait Pattern/deviations: Step-to pattern   Gait velocity interpretation: <1.31 ft/sec, indicative of household ambulator   General Gait Details: VERY slowed gait with pt needing cues for sequencing each step, cues for upright trunk with pt able to make corrective changes but unabel to maintain, pt needing ~20 mins to compelte 10' of gait   Stairs             Wheelchair Mobility     Tilt Bed    Modified Rankin (Stroke Patients Only)       Balance Overall balance assessment: Needs assistance Sitting-balance support: No upper extremity supported, Feet supported Sitting balance-Leahy Scale: Fair     Standing balance support: Bilateral upper extremity supported, Reliant on assistive device for balance Standing balance-Leahy Scale: Poor Standing balance comment: reliant on UE support                            Communication Communication Communication: No apparent difficulties  Cognition Arousal: Alert Behavior During Therapy: WFL for tasks assessed/performed   PT - Cognitive impairments: No apparent impairments                         Following commands: Intact  Cueing Cueing Techniques: Verbal cues  Exercises      General Comments General comments (skin integrity, edema, etc.): pt daughter present and supportive      Pertinent Vitals/Pain Pain Assessment Pain Assessment: Faces Faces Pain Scale: Hurts even more Pain Location: L hip Pain Descriptors / Indicators: Sore Pain Intervention(s): Monitored during  session, Limited activity within patient's tolerance    Home Living                          Prior Function            PT Goals (current goals can now be found in the care plan section) Acute Rehab PT Goals Patient Stated Goal: to return to independence, prior level of function PT Goal Formulation: With patient/family Time For Goal Achievement: 02/25/24 Progress towards PT goals: Progressing toward goals    Frequency    7X/week      PT Plan      Co-evaluation              AM-PAC PT 6 Clicks Mobility   Outcome Measure  Help needed turning from your back to your side while in a flat bed without using bedrails?: A Little Help needed moving from lying on your back to sitting on the side of a flat bed without using bedrails?: A Little Help needed moving to and from a bed to a chair (including a wheelchair)?: A Little Help needed standing up from a chair using your arms (e.g., wheelchair or bedside chair)?: A Little Help needed to walk in hospital room?: Total (<20') Help needed climbing 3-5 steps with a railing? : Total 6 Click Score: 14    End of Session   Activity Tolerance: Patient tolerated treatment well;Patient limited by fatigue Patient left: with call bell/phone within reach;in chair;with family/visitor present Nurse Communication: Mobility status PT Visit Diagnosis: Other abnormalities of gait and mobility (R26.89);Muscle weakness (generalized) (M62.81);Pain Pain - Right/Left: Left Pain - part of body: Hip     Time: 8391-8357 PT Time Calculation (min) (ACUTE ONLY): 34 min  Charges:    $Gait Training: 8-22 mins $Therapeutic Activity: 8-22 mins PT General Charges $$ ACUTE PT VISIT: 1 Visit                     Giselle Brutus R. PTA Acute Rehabilitation Services Office: [phone]   Therisa CHRISTELLA Boor 02/23/2024, 5:00 PM

## 2024-02-23 NOTE — Progress Notes (Signed)
 Subjective: 2 Days Post-Op Procedure(s) (LRB): ARTHROPLASTY, HIP, TOTAL, ANTERIOR APPROACH (Left) Patient reports pain as moderate.  Cold this morning. Denies chest pain ,SOB or dizziness . Patient Jehovah witness does not take blood. States she would die instead of taking blood products.    Objective: Vital signs in last 24 hours: Temp:  [97.6 F (36.4 C)-102 F (38.9 C)] 98.6 F (37 C) (10/30 0811) Pulse Rate:  [93-109] 95 (10/30 0811) Resp:  [17-18] 18 (10/30 0811) BP: (120-153)/(52-72) 126/72 (10/30 0811) SpO2:  [94 %-98 %] 98 % (10/30 0811)  Intake/Output from previous day: 10/29 0701 - 10/30 0700 In: 540 [P.O.:540] Out: -  Intake/Output this shift: No intake/output data recorded.  Recent Labs    02/22/24 0436 02/23/24 0359  HGB 8.6* 7.8*   Recent Labs    02/22/24 0436 02/23/24 0359  WBC 8.3 10.8*  RBC 2.96* 2.70*  HCT 27.3* 25.1*  PLT 179 171   Recent Labs    02/22/24 0436  NA 135  K 4.5  CL 102  CO2 22  BUN 17  CREATININE 1.30*  GLUCOSE 110*  CALCIUM 9.5   No results for input(s): LABPT, INR in the last 72 hours.  Dorsiflexion/Plantar flexion intact Incision: scant drainage Compartment soft   Assessment/Plan: 2 Days Post-Op Procedure(s) (LRB): ARTHROPLASTY, HIP, TOTAL, ANTERIOR APPROACH (Left) Up with therapy Incentive spirometry Acute on chronic anemia , vitals stable.      Toshiye Kever 02/23/2024, 8:25 AM

## 2024-02-23 NOTE — Progress Notes (Signed)
   02/22/24 2138  Assess: MEWS Score  Temp (!) 101.5 F (38.6 C)  BP (!) 150/63  MAP (mmHg) 85  Pulse Rate (!) 109  Resp 18  SpO2 97 %  O2 Device Room Air  Assess: MEWS Score  MEWS Temp 2  MEWS Systolic 0  MEWS Pulse 1  MEWS RR 0  MEWS LOC 0  MEWS Score 3  MEWS Score Color Yellow  Assess: if the MEWS score is Yellow or Red  Were vital signs accurate and taken at a resting state? Yes  Does the patient meet 2 or more of the SIRS criteria? Yes  Does the patient have a confirmed or suspected source of infection? No  MEWS guidelines implemented  Yes, yellow  Treat  MEWS Interventions Considered administering scheduled or prn medications/treatments as ordered  Take Vital Signs  Increase Vital Sign Frequency  Yellow: Q2hr x1, continue Q4hrs until patient remains green for 12hrs  Escalate  MEWS: Escalate Yellow: Discuss with charge nurse and consider notifying provider and/or RRT  Notify: Charge Nurse/RN  Name of Charge Nurse/RN Notified Lourdes,RN  Provider Notification  Provider Name/Title Moore,MD  Date Provider Notified 02/23/24  Time Provider Notified 0024  Method of Notification Call  Notification Reason Change in status  Provider response No new orders  Date of Provider Response 02/23/24  Time of Provider Response 0025  Assess: SIRS CRITERIA  SIRS Temperature  1  SIRS Respirations  0  SIRS Pulse 1  SIRS WBC 0  SIRS Score Sum  2

## 2024-02-24 DIAGNOSIS — M1612 Unilateral primary osteoarthritis, left hip: Secondary | ICD-10-CM | POA: Diagnosis not present

## 2024-02-24 NOTE — Progress Notes (Signed)
 Subjective: 3 Days Post-Op Procedure(s) (LRB): ARTHROPLASTY, HIP, TOTAL, ANTERIOR APPROACH (Left) Patient reports pain as moderate.    Objective: Vital signs in last 24 hours: Temp:  [97.8 F (36.6 C)-99.1 F (37.3 C)] 99.1 F (37.3 C) (10/31 0524) Pulse Rate:  [51-121] 91 (10/31 0524) Resp:  [17-18] 18 (10/31 0524) BP: (116-145)/(53-72) 119/53 (10/31 0524) SpO2:  [81 %-100 %] 99 % (10/31 0524)  Intake/Output from previous day: 10/30 0701 - 10/31 0700 In: 240 [P.O.:240] Out: -  Intake/Output this shift: No intake/output data recorded.  Recent Labs    02/22/24 0436 02/23/24 0359  HGB 8.6* 7.8*   Recent Labs    02/22/24 0436 02/23/24 0359  WBC 8.3 10.8*  RBC 2.96* 2.70*  HCT 27.3* 25.1*  PLT 179 171   Recent Labs    02/22/24 0436  NA 135  K 4.5  CL 102  CO2 22  BUN 17  CREATININE 1.30*  GLUCOSE 110*  CALCIUM 9.5   No results for input(s): LABPT, INR in the last 72 hours.  Neurovascular intact Sensation intact distally Dorsiflexion/Plantar flexion intact Incision: scant drainage   Assessment/Plan: 3 Days Post-Op Procedure(s) (LRB): ARTHROPLASTY, HIP, TOTAL, ANTERIOR APPROACH (Left) Up with therapy Discharge home with home health today.      Angela Patton 02/24/2024, 6:36 AM

## 2024-02-24 NOTE — Progress Notes (Signed)
 Pt ready for DC this am. AVS reviewed with dtr and pt. AM meds given as well as pain med. Walker for home and gait belt. HH services called and let them know that she is going home today.

## 2024-02-24 NOTE — TOC Transition Note (Signed)
 Transition of Care [facility]) - Discharge Note   Patient Details  Name: Angela Patton MRN: [id number] Date of Birth: 07-17-1950  Transition of Care [facility]) CM/SW Contact:  Roxie KANDICE Stain, RN Phone Number: 02/24/2024, 8:35 AM   Clinical Narrative:    [name] is stable to discharge home. Follow up apt on AVS. Notified [name] with [facility] of discharge.   Final next level of care: Home w Home Health Services Barriers to Discharge: Barriers Resolved   Patient Goals and CMS Choice Patient states their goals for this hospitalization and ongoing recovery are:: to return to home CMS Medicare.gov Compare Post Acute Care list provided to:: Patient Choice offered to / list presented to : Patient      Discharge Placement             home          Discharge Plan and Services Additional resources added to the After Visit Summary for     Discharge Planning Services: CM Consult Post Acute Care Choice: Home Health          DME Arranged:  (see note)         HH Arranged: PT HH Agency: [facility] Date HH Agency Contacted: 02/22/24 Time HH Agency Contacted: [phone] Representative spoke with at [facility] Agency: Jerel Frees a message  Social Drivers of Health (SDOH) Interventions SDOH Screenings   Food Insecurity: No Food Insecurity (02/21/2024)  Housing: Unknown (02/21/2024)  Transportation Needs: No Transportation Needs (02/21/2024)  Utilities: Not At Risk (02/21/2024)  Alcohol Screen: Low Risk  (05/17/2023)  Depression (PHQ2-9): Low Risk  (12/13/2023)  Financial Resource Strain: Low Risk  (05/17/2023)  Physical Activity: Inactive (05/17/2023)  Social Connections: Socially Integrated (02/21/2024)  Stress: No Stress Concern Present (05/17/2023)  Tobacco Use: Low Risk  (02/21/2024)  Health Literacy: Adequate Health Literacy (05/17/2023)     Readmission Risk Interventions     No data to display

## 2024-02-24 NOTE — Progress Notes (Signed)
 Physical Therapy Treatment Patient Details Name: Angela Patton MRN: [id number] DOB: [birth year] Today's Date: 02/24/2024   History of Present Illness 73 y.o. female presents to [facility] hospital on 02/21/2024 for elective L THA. PMH includes HTN, CAD, cancer, HLD, CKD III.    PT Comments  Pt up in bathroom on arrival, agreeable to session and demonstrating slow but steady progress towards acute goals. Pt requiring min A for transfers sit<>stand and grossly CGA for in room gait with RW for support. Distance continues to be limited by fatigue with pain also limiting standing tolerance. Pt with improved LLE clearance and cadence this session. Pt performing seated LE therex for increased ROM and strength and verbalizing understanding of completion throughout day. Pt was educated on continued walker use to maximize functional independence, safety, and decrease risk for falls as well as appropriate activity progression, HEP and compliance, ice, safe car entry/exit and importance of continued mobility with pt verbalizing understanding. Pt continues to benefit from skilled PT services to progress toward functional mobility goals.     If plan is discharge home, recommend the following: A little help with walking and/or transfers;A little help with bathing/dressing/bathroom;Assistance with cooking/housework;Assist for transportation;Help with stairs or ramp for entrance   Can travel by private vehicle        Equipment Recommendations  BSC/3in1;Rolling walker (2 wheels)    Recommendations for Other Services       Precautions / Restrictions Precautions Precautions: Fall Recall of Precautions/Restrictions: Intact Precaution/Restrictions Comments: direct anterior THA Restrictions Weight Bearing Restrictions Per Provider Order: Yes LLE Weight Bearing Per Provider Order: Weight bearing as tolerated     Mobility  Bed Mobility Overal bed mobility: Needs Assistance             General bed mobility  comments: pt up on commode on arrival, and seated EOB at end of session    Transfers Overall transfer level: Needs assistance Equipment used: Rolling walker (2 wheels) Transfers: Sit to/from Stand, Bed to chair/wheelchair/BSC Sit to Stand: Min assist           General transfer comment: min A to boost to stand from low commode with cues for hand placement and anterior weight shift    Ambulation/Gait Ambulation/Gait assistance: Contact guard assist Gait Distance (Feet): 15 Feet Assistive device: Rolling walker (2 wheels) Gait Pattern/deviations: Step-to pattern Gait velocity: reduced     General Gait Details: improved LLE clearance and cadence, cues for RW proximity, distance limited by fatigue   Stairs             Wheelchair Mobility     Tilt Bed    Modified Rankin (Stroke Patients Only)       Balance Overall balance assessment: Needs assistance Sitting-balance support: No upper extremity supported, Feet supported Sitting balance-Leahy Scale: Fair     Standing balance support: Bilateral upper extremity supported, Reliant on assistive device for balance Standing balance-Leahy Scale: Poor Standing balance comment: reliant on UE support for dynamic tasks, able to maintain static standing without UE support for LB dressing                            Communication Communication Communication: No apparent difficulties  Cognition Arousal: Alert Behavior During Therapy: WFL for tasks assessed/performed   PT - Cognitive impairments: No apparent impairments                         Following  commands: Intact      Cueing Cueing Techniques: Verbal cues  Exercises Total Joint Exercises Long Arc Quad: AROM, Left, 10 reps, Seated General Exercises - Lower Extremity Hip Flexion/Marching: AAROM, Left, 10 reps, Seated    General Comments General comments (skin integrity, edema, etc.): pt daughter present and supportive      Pertinent  Vitals/Pain Pain Assessment Pain Assessment: Faces Faces Pain Scale: Hurts little more Pain Location: L hip Pain Descriptors / Indicators: Sore Pain Intervention(s): Monitored during session, Limited activity within patient's tolerance    Home Living                          Prior Function            PT Goals (current goals can now be found in the care plan section) Acute Rehab PT Goals Patient Stated Goal: to go home PT Goal Formulation: With patient/family Time For Goal Achievement: 02/25/24 Progress towards PT goals: Progressing toward goals    Frequency    7X/week      PT Plan      Co-evaluation              AM-PAC PT 6 Clicks Mobility   Outcome Measure  Help needed turning from your back to your side while in a flat bed without using bedrails?: A Little Help needed moving from lying on your back to sitting on the side of a flat bed without using bedrails?: A Little Help needed moving to and from a bed to a chair (including a wheelchair)?: A Little Help needed standing up from a chair using your arms (e.g., wheelchair or bedside chair)?: A Little Help needed to walk in hospital room?: Total (<20') Help needed climbing 3-5 steps with a railing? : Total 6 Click Score: 14    End of Session   Activity Tolerance: Patient tolerated treatment well;Patient limited by fatigue Patient left: with call bell/phone within reach;with family/visitor present;in bed (seated up EOB) Nurse Communication: Mobility status PT Visit Diagnosis: Other abnormalities of gait and mobility (R26.89);Muscle weakness (generalized) (M62.81);Pain Pain - Right/Left: Left Pain - part of body: Hip     Time: 9168-9152 PT Time Calculation (min) (ACUTE ONLY): 16 min  Charges:    $Therapeutic Activity: 8-22 mins PT General Charges $$ ACUTE PT VISIT: 1 Visit                     Angela Minton R. PTA Acute Rehabilitation Services Office: [phone]   Angela Patton 02/24/2024,  9:24 AM

## 2024-02-24 NOTE — Plan of Care (Signed)
   Problem: Activity: Goal: Risk for activity intolerance will decrease Outcome: Progressing   Problem: Nutrition: Goal: Adequate nutrition will be maintained Outcome: Progressing   Problem: Pain Managment: Goal: General experience of comfort will improve and/or be controlled Outcome: Progressing

## 2024-02-24 NOTE — Discharge Summary (Signed)
 Patient ID: Angela Patton MRN: [id number] DOB/AGE: 74/07/1950 74 y.o.  Admit date: 02/21/2024 Discharge date: 02/24/2024  Admission Diagnoses:  Principal Problem:   Unilateral primary osteoarthritis, left hip Active Problems:   Status post total replacement of left hip   Discharge Diagnoses:  Same  Past Medical History:  Diagnosis Date   Allergy    Anemia    Arthritis    Asthma    Cancer (HCC)    colon    Chest pain    Hospital 09/2010,  /   Stress echo October 29, 2010, vigorous LV function with stress.  All walls could not be assessed fully but it was felt that there was no ischemia.   Coronary artery disease    MI per patient 2007 elsewhere, no records, pt. says cath  was [facility]   Diverticulitis    Elevated CPK    Hospital 09/2010,  troponin normal   GERD (gastroesophageal reflux disease)    Hyperlipidemia    Hypertension    Kidney stone    MI (myocardial infarction) (HCC)    2007    Surgeries: Procedure(s): ARTHROPLASTY, HIP, TOTAL, ANTERIOR APPROACH on 02/21/2024   Consultants:   Discharged Condition: Improved  Hospital Course: Angela Patton is an 73 y.o. female who was admitted 02/21/2024 for operative treatment ofUnilateral primary osteoarthritis, left hip. Patient has severe unremitting pain that affects sleep, daily activities, and work/hobbies. After pre-op clearance the patient was taken to the operating room on 02/21/2024 and underwent  Procedure(s): ARTHROPLASTY, HIP, TOTAL, ANTERIOR APPROACH.    Patient was given perioperative antibiotics:  Anti-infectives (From admission, onward)    Start     Dose/Rate Route Frequency Ordered Stop   02/21/24 1815  ceFAZolin (ANCEF) IVPB 2g/100 mL premix        2 g 200 mL/hr over 30 Minutes Intravenous Every 6 hours 02/21/24 1718 02/22/24 0047   02/21/24 0815  ceFAZolin (ANCEF) IVPB 2g/100 mL premix        2 g 200 mL/hr over 30 Minutes Intravenous On call to O.R. 02/21/24 0807 02/21/24 1054        Patient was  given sequential compression devices, early ambulation, and chemoprophylaxis to prevent DVT.  Inpatient Morphine Milligram Equivalents Per Day 10/28 - 10/31   Values displayed are in units of MME/Day    Order Start / End Date 10/28 10/29 Yesterday Today    oxyCODONE  (Oxy IR/ROXICODONE ) immediate release tablet 5 mg 10/28 - 10/28 0 of Unknown -- -- --    oxyCODONE  (ROXICODONE ) 5 MG/5ML solution 5 mg 10/28 - 10/28 0 of Unknown -- -- --      Group total: 0 of Unknown       fentaNYL  citrate (PF) (SUBLIMAZE ) injection 10/28 - 10/28 *60 of 60 -- -- --    fentaNYL  (SUBLIMAZE ) injection 25-50 mcg 10/28 - 10/28 45 of 45-90 -- -- --    Daily Totals  * 105 of Unknown (at least 105-150) -- -- --  *One-Step medication  Calculation Errors     Order Type Date Details   oxyCODONE  (Oxy IR/ROXICODONE ) immediate release tablet 5 mg Ordered Dose -- Insufficient frequency information   oxyCODONE  (ROXICODONE ) 5 MG/5ML solution 5 mg Ordered Dose -- Insufficient frequency information            Patient benefited maximally from hospital stay and there were no complications.    Recent vital signs: Patient Vitals for the past 24 hrs:  BP Temp Temp src Pulse Resp SpO2  02/24/24 0524 (!) 119/53 99.1 F (37.3 C) Oral 91 18 99 %  02/23/24 2251 (!) 127/54 -- -- (!) 104 18 100 %  02/23/24 1846 -- -- -- 60 -- 100 %  02/23/24 1829 -- -- -- (!) 51 -- (!) 81 %  02/23/24 1731 116/67 98.6 F (37 C) -- (!) 119 17 (!) 88 %  02/23/24 1236 -- -- -- (!) 108 -- --  02/23/24 1233 (!) 145/61 97.8 F (36.6 C) -- (!) 121 18 95 %     Recent laboratory studies:  Recent Labs    02/22/24 0436 02/23/24 0359  WBC 8.3 10.8*  HGB 8.6* 7.8*  HCT 27.3* 25.1*  PLT 179 171  NA 135  --   K 4.5  --   CL 102  --   CO2 22  --   BUN 17  --   CREATININE 1.30*  --   GLUCOSE 110*  --   CALCIUM 9.5  --      Discharge Medications:   Allergies as of 02/24/2024       Reactions   Penicillins Hives, Swelling   Has patient had  a PCN reaction causing immediate rash, facial/tongue/throat swelling, SOB or lightheadedness with hypotension:  Has patient had a PCN reaction causing severe rash involving mucus membranes or skin necrosis:  Has patient had a PCN reaction that required hospitalization:  Has patient had a PCN reaction occurring within the last 10 years:  If all of the above answers are NO, then may proceed with Cephalosporin use.   Other    J[employer]ov[employer]-refuse blood transfusions and blood products         Medication List     TAKE these medications    Advil  200 MG Caps Generic drug: Ibuprofen  Take 400 mg by mouth every 8 (eight) hours as needed (pain.).   albuterol  108 (90 Base) MCG/ACT inhaler Commonly known as: VENTOLIN  HFA Inhale 1-2 puffs into the lungs every 6 (six) hours as needed for wheezing or shortness of breath.   aspirin 81 MG chewable tablet Chew 1 tablet (81 mg total) by mouth 2 (two) times daily.   methocarbamol 500 MG tablet Commonly known as: ROBAXIN Take 1 tablet (500 mg total) by mouth every 6 (six) hours as needed for muscle spasms.   oxyCODONE  5 MG immediate release tablet Commonly known as: Oxy IR/ROXICODONE  Take 1-2 tablets (5-10 mg total) by mouth every 6 (six) hours as needed for moderate pain (pain score 4-6) (pain score 4-6).   Salonpas Pain Relief Patch Ptch Place 1 patch onto the skin daily as needed (pain.).   Muscle Rub 10-15 % Crea Apply 1 Application topically as needed for muscle pain.               Durable Medical Equipment  (From admission, onward)           Start     Ordered   02/21/24 1719  DME 3 n 1  Once        02/21/24 1718   02/21/24 1719  DME Walker rolling  Once       Question Answer Comment  Walker: With 5 Inch Wheels   Patient needs a walker to treat with the following condition Status post total replacement of left hip      02/21/24 1718            Diagnostic Studies: DG Pelvis Portable Result Date:  02/21/2024 EXAM: 1 or 2 VIEW(S) XRAY OF THE PELVIS  02/21/2024 07:38:00 PM COMPARISON: Comparison is made with 12/13/2023. CLINICAL HISTORY: [id number] Post-operative state 252351. Reason for exam: post op exam, left hip post op exam, left hip FINDINGS: BONES AND JOINTS: Previous right hip replacement. Interval left hip replacement with intact hardware and normal alignment. No acute fracture. No focal osseous lesion. No joint dislocation. SOFT TISSUES: The soft tissues are unremarkable. IMPRESSION: 1. Interval left hip replacement with intact hardware and normal alignment. 2. No acute osseous abnormality. Electronically signed by: Luke Bun MD 02/21/2024 08:22 PM EDT RP Workstation: [id number]   DG HIP UNILAT WITH PELVIS 1V LEFT Result Date: 02/21/2024 CLINICAL DATA:  Elective surgery. EXAM: DG HIP (WITH OR WITHOUT PELVIS) 1V*L* COMPARISON:  None Available. FINDINGS: Three fluoroscopic spot views of the pelvis and left hip obtained in the operating room. Images during hip arthroplasty. Fluoroscopy time 18 seconds. Dose 1.7099 mGy. IMPRESSION: Intraoperative fluoroscopy during left hip arthroplasty. Electronically Signed   By: Andrea Gasman M.D.   On: 02/21/2024 12:37   DG C-Arm 1-60 Min-No Report Result Date: 02/21/2024 Fluoroscopy was utilized by the requesting physician.  No radiographic interpretation.   DG C-Arm 1-60 Min-No Report Result Date: 02/21/2024 Fluoroscopy was utilized by the requesting physician.  No radiographic interpretation.   US  RENAL Result Date: 02/10/2024 CLINICAL DATA:  CKD stage 3 EXAM: RENAL / URINARY TRACT ULTRASOUND COMPLETE COMPARISON:  CT abdomen pelvis 02/23/2023 FINDINGS: Right Kidney: Renal measurements: 9.0 x 3.4 x 4.0 cm = volume: 64 mL. Echogenicity within normal limits. No mass or hydronephrosis visualized. Left Kidney: Renal measurements: 11.3 x 4.6 x 4.3 cm = volume: 119 mL. Echogenicity within normal limits. No mass or hydronephrosis visualized. Bladder:  Limited evaluation due to underdistention.  Grossly unremarkable. Other: Diffuse increased echogenicity of the visualized portions of the hepatic parenchyma are a nonspecific indicator of hepatocellular dysfunction, most commonly steatosis. IMPRESSION: Asymmetrically smaller RIGHT kidney. Otherwise unremarkable sonographic evaluation of the kidneys. Electronically Signed   By: Aliene Lloyd M.D.   On: 02/10/2024 13:48    Disposition: Discharge disposition: 01-Home or Self Care          Contact information for follow-up providers     Vernetta Lonni GRADE, MD Follow up in 2 week(s).   Specialty: Orthopedic Surgery Contact information: [address] [place] [address] [phone]         [place], [facility] Follow up.   Specialty: Home Health Services Contact information: [address] [place] [address] [place] [address] [phone]              Contact information for after-discharge care     Home Medical Care     [facility]) .   Service: Home Health Services Contact information: [address] [place]  [address] [phone]                      Signed: Lonni GRADE Vernetta 02/24/2024, 10:06 AM

## 2024-02-24 NOTE — Progress Notes (Signed)
 Dr already in to DC pt and placed a new dressing on the left hip area. Pt has called dtr to pick her up.AVS printed.

## 2024-02-27 ENCOUNTER — Encounter: Payer: Self-pay | Admitting: Radiology

## 2024-03-05 ENCOUNTER — Ambulatory Visit: Admitting: Physician Assistant

## 2024-03-05 ENCOUNTER — Encounter: Payer: Self-pay | Admitting: Physician Assistant

## 2024-03-05 DIAGNOSIS — Z96642 Presence of left artificial hip joint: Secondary | ICD-10-CM

## 2024-03-05 MED ORDER — OXYCODONE HCL 5 MG PO TABS: 5.0000 mg | ORAL_TABLET | Freq: Four times a day (QID) | ORAL | 0 refills | Status: DC | PRN

## 2024-03-05 NOTE — Progress Notes (Signed)
 HPI: [name] returns today 2 weeks status post left total hip arthroplasty.  She reports overall she is doing well.  Denies any fevers chills shortness of breath chest pain.  She unfortunately missed the fact that she needed to be on aspirin for DVT prophylaxis.  She was on no aspirin prior to surgery.  She is using a walker to ambulate.  Asking for refill on her oxycodone .  Review of systems: See HPI  Physical exam: General well-developed well-nourished female no acute distress.  Ambulates with rolling walker. Left hip: Surgical incision is healing well proximal incision there is area of abrasion and unhealed skin.  The skin overall is well-approximated no gross dehiscence.  No malodor.  No significant drainage.  Positive seroma 80 cc of serosanguineous fluid was aspirated today.  Left calf supple nontender.  Dorsiflexion plantarflexion left ankle intact.  Impression: Status post left total hip arthroplasty 02/21/2024  Plan: She will go on an aspirin 81 mg enteric-coated for 1 week once daily.  Will see her back in 1 week and we will remove her sutures at that time.  In the interim she is given dressing material and she will apply Xeroform to the proximal wound removing it every 2 days and washing the area with an antibacterial soap.  Questions were encouraged and answered at length.  Refill on oxycodone  was given.

## 2024-03-12 ENCOUNTER — Ambulatory Visit: Admitting: Physician Assistant

## 2024-03-12 ENCOUNTER — Encounter: Payer: Self-pay | Admitting: Physician Assistant

## 2024-03-12 DIAGNOSIS — Z96642 Presence of left artificial hip joint: Secondary | ICD-10-CM

## 2024-03-12 MED ORDER — DOXYCYCLINE HYCLATE 100 MG PO TABS: 100.0000 mg | ORAL_TABLET | Freq: Two times a day (BID) | ORAL | 0 refills | Status: AC

## 2024-03-12 MED ORDER — OXYCODONE HCL 5 MG PO TABS: 5.0000 mg | ORAL_TABLET | Freq: Four times a day (QID) | ORAL | 0 refills | Status: DC | PRN

## 2024-03-12 NOTE — Progress Notes (Signed)
 HPI: Angela Patton returns today status post left total hip arthroplasty 02/21/2024.  She is doing her home exercise.  She had some drainage that developed distal wound this past Wednesday.  She denies any fevers chills.  She is taking oxycodone  and muscle relaxants for pain.  She feels like she is slowly improving.  Physical exam: General well-developed well-nourished female no acute distress. Left hip surgical incisions well-approximated with interrupted nylon sutures no signs of gross infection.  Positive seroma 38 cc aspirated.  Distal incision with slight serosanguineous drainage.  Proximal incision with area of an abrasion.  There is no malodor.  Left calf supple nontender.  Dorsiflexion plantarflexion left ankle intact.  Smooth range of motion of the left hip with internal/external rotation passively.   Impression: Status post left total hip arthroplasty  Plan: Sutures removed today.  Xeroform applied to the proximal incision.  She will change the dressing in 2 days and then wash the area with antibacterial soap then begin applying Bactrim  ointment that was given to the proximal incision.  Will also place her on doxycycline.  She will follow-up with us  in 1 week for wound exam sooner if there is any questions concerns.

## 2024-03-16 ENCOUNTER — Telehealth: Payer: Self-pay

## 2024-03-16 NOTE — Telephone Encounter (Signed)
 [facility] pharmacy to verify patient had received antibiotics and patient picked up doxy on 11/17

## 2024-03-19 ENCOUNTER — Ambulatory Visit: Admitting: Physician Assistant

## 2024-03-19 ENCOUNTER — Encounter: Payer: Self-pay | Admitting: Physician Assistant

## 2024-03-19 DIAGNOSIS — Z96642 Presence of left artificial hip joint: Secondary | ICD-10-CM

## 2024-03-19 NOTE — Progress Notes (Signed)
 HPI: Ms. Schnee returns today for follow-up of her left total hip arthroplasty.  She has been taking doxycycline .  She is washing the wound with an antibacterial soap and applying Bactroban to the proximal wound.  She denies any fevers chills.  She is ambulating with a walker.  She does complain of numbness to the touch about the incision but no burning sensation.  Physical exam: Left hip surgical incisions healing well.  The proximal wound is healing up well.  There is no drainage from the proximal wound.  No malodor.  Distal incision there is a small punctate area with serous drainage.  Unable to express any purulence.  Otherwise incisions healing well.  Calf supple nontender.  Dorsiflexion plantarflexion left ankle intact.  Fluid motion of the left hip with some discomfort with internal rotation.  Impression: Status post left total hip arthroplasty 02/21/2024  Plan: She will finish the doxycycline .  Continue to place Bactroban over the distal incision.  Follow-up with us  in 2 weeks sooner if there is any questions concerns.  Questions were encouraged and answered at length.

## 2024-04-04 ENCOUNTER — Encounter: Payer: Self-pay | Admitting: Physician Assistant

## 2024-04-04 ENCOUNTER — Ambulatory Visit (INDEPENDENT_AMBULATORY_CARE_PROVIDER_SITE_OTHER): Admitting: Physician Assistant

## 2024-04-04 DIAGNOSIS — Z96642 Presence of left artificial hip joint: Secondary | ICD-10-CM

## 2024-04-04 MED ORDER — DOXYCYCLINE HYCLATE 100 MG PO CAPS: 100.0000 mg | ORAL_CAPSULE | Freq: Two times a day (BID) | ORAL | 1 refills | Status: AC

## 2024-04-04 MED ORDER — HYDROCODONE-ACETAMINOPHEN 5-325 MG PO TABS: 1.0000 | ORAL_TABLET | Freq: Four times a day (QID) | ORAL | 0 refills | Status: AC | PRN

## 2024-04-04 NOTE — Progress Notes (Signed)
 HPI: Angela Patton returns today status post left total hip arthroplasty 02/21/2024.  She presents today using a rolling walker.  Said no fevers chills.  States she has been changing the bandage every other day.  She is no longer on doxycycline .  Ran out of her oxycodone  a few days ago since then has been just taking Tylenol .  States she is having stabbing pain in the left hip.  Her daughter states that she is not moving much but is getting around the house some with the use of either a cane or rolling walker.  Notes that she did pull a black stitch out of her incision just proximal to the area noted still draining.  Review of systems: See HPI  Physical exam:  General: Well-developed well-nourished female no acute distress. Respirations: Unlabored Left hip: Surgical incisions healing well.  The distal incision there is punctate wound with serosanguineous drainage.  Attempts at expression of any purulence results into this bloody drainage.  No evidence of erythema or infection about the hip.  Good range of motion of the hip passively with internal/external rotation.  Calf supple nontender.  Dorsiflexion of plantarflexion of the left ankle intact.  Impression: Status post left total hip arthroplasty 6 weeks postop.  Plan: Will check CBC, sed rate and CRP on her today.  Place her back on doxycycline  given the continued drainage distal wound.  She will apply small amount of Bactroban over the area after washing the area daily with antibacterial soap.  She is placed on Norco for pain.  Will have her follow-up with Dr. Nelida in 3 weeks to see how she is doing overall.  Questions encouraged and answered at length.  Encouraged her to walk.

## 2024-04-05 LAB — CBC WITH DIFFERENTIAL/PLATELET
Absolute Lymphocytes: 3263 {cells}/uL (ref 850–3900)
Absolute Monocytes: 482 {cells}/uL (ref 200–950)
Basophils Absolute: 27 {cells}/uL (ref 0–200)
Basophils Relative: 0.4 %
Eosinophils Absolute: 328 {cells}/uL (ref 15–500)
Eosinophils Relative: 4.9 %
HCT: 25.4 % — ABNORMAL LOW (ref 35.9–46.0)
Hemoglobin: 8 g/dL — ABNORMAL LOW (ref 11.7–15.5)
MCH: 27.2 pg (ref 27.0–33.0)
MCHC: 31.5 g/dL — ABNORMAL LOW (ref 31.6–35.4)
MCV: 86.4 fL (ref 81.4–101.7)
MPV: 9.2 fL (ref 7.5–12.5)
Monocytes Relative: 7.2 %
Neutro Abs: 2600 {cells}/uL (ref 1500–7800)
Neutrophils Relative %: 38.8 %
Platelets: 276 Thousand/uL (ref 140–400)
RBC: 2.94 Million/uL — ABNORMAL LOW (ref 3.80–5.10)
RDW: 14.2 % (ref 11.0–15.0)
Total Lymphocyte: 48.7 %
WBC: 6.7 Thousand/uL (ref 3.8–10.8)

## 2024-04-05 LAB — SEDIMENTATION RATE: Sed Rate: 80 mm/h — ABNORMAL HIGH (ref 0–30)

## 2024-04-05 LAB — C-REACTIVE PROTEIN: CRP: 5.3 mg/L (ref <8.0)

## 2024-04-23 ENCOUNTER — Encounter: Payer: Self-pay | Admitting: Orthopaedic Surgery

## 2024-04-23 ENCOUNTER — Ambulatory Visit (INDEPENDENT_AMBULATORY_CARE_PROVIDER_SITE_OTHER): Admitting: Orthopaedic Surgery

## 2024-04-23 DIAGNOSIS — Z96642 Presence of left artificial hip joint: Secondary | ICD-10-CM

## 2024-04-23 NOTE — Progress Notes (Signed)
 The patient is a 73 year old female who is now 2 months status post a left total hip replacement.  We were seeing her back early due to small area of drainage that had been around her hip incision distally.  That has since dried up.  She has been on doxycycline  but overall is mobilizing better according to her daughter.  She is walking much more than she was before.  On exam her left hip incision has healed over and there is no redness and no drainage at all.  She will continue to increase her activities as comfort allows.  The next time when he is here is in 6 weeks.  If there are issues before then they know to come see us .  At 6 weeks we will have a standing AP pelvis and lateral of her left operative hip.

## 2024-06-04 ENCOUNTER — Encounter: Admitting: Orthopaedic Surgery

## 2024-06-05 ENCOUNTER — Ambulatory Visit
